# Patient Record
Sex: Female | Born: 1965 | Race: White | Hispanic: No | Marital: Married | Smoking: Never smoker
Health system: Southern US, Community
[De-identification: ages and names within clinical notes are randomized; demographics above are authoritative.]

## PROBLEM LIST (undated history)

## (undated) DIAGNOSIS — R519 Headache, unspecified: Secondary | ICD-10-CM

## (undated) DIAGNOSIS — K219 Gastro-esophageal reflux disease without esophagitis: Secondary | ICD-10-CM

## (undated) DIAGNOSIS — D649 Anemia, unspecified: Secondary | ICD-10-CM

## (undated) DIAGNOSIS — G4700 Insomnia, unspecified: Secondary | ICD-10-CM

## (undated) DIAGNOSIS — E611 Iron deficiency: Secondary | ICD-10-CM

## (undated) HISTORY — DX: Gastro-esophageal reflux disease without esophagitis: K21.9

## (undated) HISTORY — DX: Anemia, unspecified: D64.9

## (undated) HISTORY — DX: Insomnia, unspecified: G47.00

## (undated) HISTORY — PX: DENTAL SURGERY: SHX609

## (undated) HISTORY — DX: Iron deficiency: E61.1

## (undated) HISTORY — DX: Headache, unspecified: R51.9

---

## 2005-04-27 ENCOUNTER — Encounter: Payer: Self-pay | Admitting: Internal Medicine

## 2005-05-12 ENCOUNTER — Encounter: Payer: Self-pay | Admitting: Internal Medicine

## 2006-06-13 ENCOUNTER — Encounter: Payer: Self-pay | Admitting: Internal Medicine

## 2008-11-27 ENCOUNTER — Encounter: Payer: Self-pay | Admitting: Internal Medicine

## 2008-12-12 ENCOUNTER — Encounter: Payer: Self-pay | Admitting: Internal Medicine

## 2009-03-03 ENCOUNTER — Encounter: Payer: Self-pay | Admitting: Internal Medicine

## 2009-06-23 ENCOUNTER — Encounter: Payer: Self-pay | Admitting: Internal Medicine

## 2009-06-26 ENCOUNTER — Encounter: Payer: Self-pay | Admitting: Internal Medicine

## 2009-07-14 ENCOUNTER — Encounter: Payer: Self-pay | Admitting: Internal Medicine

## 2009-08-12 ENCOUNTER — Ambulatory Visit: Payer: Self-pay | Admitting: Internal Medicine

## 2009-08-12 DIAGNOSIS — J45909 Unspecified asthma, uncomplicated: Secondary | ICD-10-CM | POA: Insufficient documentation

## 2009-08-26 ENCOUNTER — Ambulatory Visit: Payer: Self-pay | Admitting: Internal Medicine

## 2009-09-08 ENCOUNTER — Encounter: Payer: Self-pay | Admitting: Internal Medicine

## 2010-02-09 ENCOUNTER — Telehealth (INDEPENDENT_AMBULATORY_CARE_PROVIDER_SITE_OTHER): Payer: Self-pay | Admitting: *Deleted

## 2010-05-11 NOTE — Progress Notes (Signed)
Summary: oil based vitamins ok in moderation  Phone Note Call from Patient Call back at [phone]   Caller: Patient Call For: Raziah Funnell Summary of Call: Wants to know if it will be ok for her to take visalus if she didn't take the oily part of the vitamin, pls advise. Initial call taken by: Darletta Moll,  February 09, 2010 1:13 PM  Follow-up for Phone Call        ok to use this and even take oil based vitamins in moderation  but take at breakfast and stop them if start any coughing or wheezing  Follow-up by: Nyoka Cowden MD,  February 09, 2010 3:02 PM  Additional Follow-up for Phone Call Additional follow up Details #1::        Called, spoke with pt. She was informed of above per MW and verbalized understanding. Additional Follow-up by: Gweneth Dimitri RN,  February 09, 2010 3:13 PM

## 2010-05-11 NOTE — Letter (Signed)
Summary: [facility]  [facility]   Imported By: Sherian Rein 08/21/2009 12:25:35  _____________________________________________________________________  External Attachment:    Type:   Image     Comment:   External Document

## 2010-05-11 NOTE — Letter (Signed)
Summary: [facility]   [facility]   Imported By: Sherian Rein 08/21/2009 12:19:15  _____________________________________________________________________  External Attachment:    Type:   Image     Comment:   External Document

## 2010-05-11 NOTE — Assessment & Plan Note (Signed)
Summary: Pulmonary/ ext summary f/u ov with hfa teaching @ 75%   Copy to:  Self  CC:  2 wk followup.  Pt states that the cough has almost completely resolved.  She states that symbicort helps alot.  No complaints today.  Marland Kitchen  History of Present Illness: 73 yowf never regular smoker dx with asthma age 45 resolved after several years then around 2007 developed urge to clear throat > asthma challenge test positive in [place] but no meds prescribed and continued to have the throat clearing then Aug 2010 developed cough and wheeze and doe and never able to achieve consistent control of symptoms since then on advair and singulair maint.  Aug 12, 2009 cc cough minimal green mucus and gag and vomit with it, on advair and singulair x 3 months no benefit, best proaire and using nebulizer twice a night. rec change to symbicort 160, rx gerd.  Aug 26, 2009 cc  cough has almost completely resolved.  She states that symbicort helps alot.  No complaints today.  Pt denies any significant sore throat, dysphagia, itching, sneezing,  nasal congestion or excess secretions,  fever, chills, sweats, unintended wt loss, pleuritic or exertional cp, hempoptysis, change in activity tolerance  orthopnea pnd or leg swelling Pt also denies any obvious fluctuation in symptoms with weather or environmental change or other alleviating or aggravating factors.     Pt denies any increase in rescue therapy over baseline, denies waking up needing it or having early am exacerbations of coughing/wheezing/ or dyspnea      Current Medications (verified): 1)  Symbicort 160-4.5 Mcg/act  Aero (Budesonide-Formoterol Fumarate) .... 2 Puffs First Thing  in Am and 2 Puffs Again in Pm About 12 Hours Later 2)  Dexilant 60 Mg Cpdr (Dexlansoprazole) .... Take  One 30-60 Min Before First Meal of The Day 3)  Pepcid Ac Maximum Strength 20 Mg Tabs (Famotidine) .... One At Bedtime 4)  Klonopin 0.5 Mg Tabs (Clonazepam) .Marland Kitchen.. 1 At Bedtime If Needed 5)   Proair Hfa 108 (90 Base) Mcg/act Aers (Albuterol Sulfate) .... 2 Puffs Every 4-6 Hours As Needed  Allergies (verified): 1)  ! Prednisone  Past History:  Past Medical History: Asthma    - Nl pft's 06/13/06     -nl CT chest 12/12/08     - Pos atopy Barb Merino) 2011 declined immunotherapy 07/14/2009     -HFA 50-75% Aug 12, 2009 > 75% Aug 26, 2009        Vital Signs:  Patient profile:   45 year old female Weight:      112 pounds O2 Sat:      100 % on Room air Temp:     98.3 degrees F oral Pulse rate:   62 / minute BP sitting:   112 / 72  (left arm)  Vitals Entered By: Vernie Murders (Aug 26, 2009 10:48 AM)  O2 Flow:  Room air  Physical Exam  Additional Exam:  amb wf wt 111  Aug 12, 2009 > 112 Aug 26, 2009  HEENT: nl dentition, turbinates, and orophanx. Nl external ear canals without cough reflex NECK :  without JVD/Nodes/TM/ nl carotid upstrokes bilaterally LUNGS: no acc muscle use, insp and exp rhonchi with upper and lower components CV:  RRR  no s3 or murmur or increase in P2, no edema  ABD:  soft and nontender with nl excursion in the supine position. No bruits or organomegaly, bowel sounds nl MS:  warm without deformities, calf tenderness,  cyanosis or clubbing SKIN: warm and dry without lesions   NEURO:  alert, approp, no deficits     Impression & Recommendations:  Problem # 1:  ASTHMA, UNSPECIFIED (ICD-493.90) All goals of asthma met including optimal function and elimination of symptoms with minimum need for rescue therapy. Contingencies discussed today including the rule of two's.   I spent extra time with the patient today explaining optimal mdi  technique.  This improved from  50-75% - if consistent with effective technique should be able to use just the symbiocort 160 and stop the empiric dexilant, noting that cough to vomit may have been a secondary and not a primary problem.  Each maintenance medication was reviewed in detail including most importantly the difference  between maintenance prns and under what circumstances the prns are to be used.  In addition, these two groups (for which the patient should keep up with refills) were distinguished from a third group :  meds that are used only short term with the intent to complete a course of therapy and then not refill them.  The med list was then fully reconciled and reorganized to reflect this important distinction.   Medications Added to Medication List This Visit: 1)  Symbicort 80-4.5 Mcg/act Aero (Budesonide-formoterol fumarate) .... 2 puffs first thing  in am and 2 puffs again in pm about 12 hours later  Other Orders: Est. Patient Level IV (16109)  Patient Instructions: 1)  Work on perfecting inhaler technique:  relax and blow all the way out then take a nice smooth deep breath back in, triggering the inhaler at same time you start breathing in and hold for a few secs 2)  Symbicort 80 2 puffs first thing  in am and 2 puffs again in pm about 12 hours later  3)  If your breathing worsens or you need to use your rescue inhaler more than twice weekly or wake up more than twice a month with any respiratory symptoms or require more than two rescue inhalers per year, we need to see you right away. 4)  When you dexilant run out of dexilant try off it.  5)  Return to office in 3 months, sooner if needed  Prescriptions: SYMBICORT 80-4.5 MCG/ACT  AERO (BUDESONIDE-FORMOTEROL FUMARATE) 2 puffs first thing  in am and 2 puffs again in pm about 12 hours later  #1 x 11   Entered and Authorized by:   Nyoka Cowden MD   Signed by:   Nyoka Cowden MD on 08/26/2009   Method used:   Electronically to        [facility] [address] (retail)       [address] [place], [place]  [address]       Ph: [phone]       Fax: [phone]   RxID:   [id number]

## 2010-05-11 NOTE — Op Note (Signed)
Summary: Bronchoscopy/[facility]  Bronchoscopy/[facility]   Imported By: Sherian Rein 08/21/2009 12:31:52  _____________________________________________________________________  External Attachment:    Type:   Image     Comment:   External Document

## 2010-05-11 NOTE — Letter (Signed)
Summary: [facility]  [facility]   Imported By: Sherian Rein 08/21/2009 12:23:31  _____________________________________________________________________  External Attachment:    Type:   Image     Comment:   External Document

## 2010-05-11 NOTE — Assessment & Plan Note (Signed)
Summary: Pulmonary/ new pt eval for dtc asthma > change to symbicort   Visit Type:  Initial Consult Copy to:  Self  CC:  Cough and Dyspnea.  History of Present Illness: 55 yowf never regular smoker dx with asthma age 45 resolved after several years then around 2007 developed urge to clear throat > asthma challenge test positive but no meds prescribed and continued to have the throat clearing then Aug 2010 developed cough and wheeze and doe and never able to achieve consistent control of symptoms since then on advair and singulair maint.  Aug 12, 2009 cc cough minimal green mucus and gag and vomit with it, on advair and singulair x 3 months no benefit, best proaire and using nebulizer twice a night.  Pt denies any significant  dysphagia, itching, sneezing,  nasal congestion or excess secretions,  fever, chills, sweats, unintended wt loss, pleuritic or exertional cp, hempoptysis, change in activity tolerance  orthopnea pnd or leg swelling Pt also denies any obvious fluctuation in symptoms with weather or environmental change or other alleviating or aggravating factors.       Current Medications (verified): 1)  Singulair 10 Mg Tabs (Montelukast Sodium) .Marland Kitchen.. 1 Once Daily 2)  Advair Diskus 250-50 Mcg/dose Aepb (Fluticasone-Salmeterol) .Marland Kitchen.. 1 Puff Two Times A Day 3)  Diphenhist 25 Mg Tabs (Diphenhydramine Hcl) .... 2 At Bedtime As Needed 4)  Tussionex Pennkinetic Er 8-10 Mg/19ml Lqcr (Chlorpheniramine-Hydrocodone) .Marland Kitchen.. 1 Tsp Every 12 Hours As Needed 5)  Proair Hfa 108 (90 Base) Mcg/act Aers (Albuterol Sulfate) .... 2 Puffs Every 4-6 Hours As Needed 6)  Dexilant 60 Mg Cpdr (Dexlansoprazole) .Marland Kitchen.. 1 Once Daily 7)  Klonopin 0.5 Mg Tabs (Clonazepam) .Marland Kitchen.. 1 At Bedtime 8)  Tramadol Hcl 50 Mg Tabs (Tramadol Hcl) .Marland Kitchen.. 1 Once Daily As Needed 9)  Mucinex Dm 30-600 Mg Xr12h-Tab (Dextromethorphan-Guaifenesin) .Marland Kitchen.. 1 To 2 Every 12 Hours As Needed 10)  Ativan 0.5 Mg Tabs (Lorazepam) .Marland Kitchen.. 1 Two Times A Day As  Needed  Allergies (verified): 1)  ! 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  The patient denies coughing up blood, irregular heartbeats, acid heartburn, indigestion, loss of appetite, weight change, abdominal pain, difficulty swallowing, tooth/dental problems, nasal congestion/difficulty breathing through nose, sneezing, itching, ear ache, depression, hand/feet swelling, joint stiffness or pain, rash, change in color of mucus, and fever.    Vital Signs:  Patient profile:   45 year old female Height:      62 inches Weight:      111.25 pounds BMI:     20.42 O2 Sat:      96 % on Room air Temp:     97.9 degrees F oral Pulse rate:   113 / minute BP sitting:   148 / 92  (left arm)  Vitals Entered By: Vernie Murders (Aug 12, 2009 9:14 AM)  O2 Flow:  Room air  Physical Exam  Additional Exam:  amb wf wt 111  Aug 12, 2009 HEENT: nl dentition, turbinates, and orophanx. Nl external ear canals without cough reflex NECK :  without JVD/Nodes/TM/ nl carotid upstrokes bilaterally LUNGS: no acc muscle use, insp and exp rhonchi with upper and lower components CV:  RRR  no s3 or murmur or increase in P2, no edema  ABD:  soft and nontender with nl excursion in the supine position. No bruits or organomegaly, bowel sounds nl MS:  warm without deformities, calf tenderness, cyanosis or clubbing SKIN: warm and dry without lesions   NEURO:  alert, approp, no deficits      Impression & Recommendations:  Problem # 1:  ASTHMA, UNSPECIFIED (ICD-493.90) DDX of  difficult airways managment all start with A and  include Adherence, Ace Inhibitors, Acid Reflux, Active Sinus Disease, Alpha 1 Antitripsin deficiency, Anxiety masquerading as Airways dz,  ABPA,  allergy(esp in young), Aspiration (esp in elderly), Adverse effects of DPI,  Active smokers, plus one B  = Beta blocker use..   Adherence: I spent extra time with the patient today explaining optimal mdi  technique.  This improved from  50-75% effective Acid reflux :  ppi and diet reviewed Adverse effect of DPI, try off advair and on symbicort Anxiety: try change to just clonazepam use and stop ativan biggest issue and starts with  inability to use HFA effectively and also  understand that SABA treats the symptoms but doesn't get to the underlying problem (inflammation).  I used  the ananology of putting steroid cream on a rash to help explain the meaning of topical therapy and the need to get the drug to the target tissue.   a course of prednisone should help normalize lung function and let her learn optimal inspiratory technique.  Medications Added to Medication List This Visit: 1)  Symbicort 160-4.5 Mcg/act Aero (Budesonide-formoterol fumarate) .... 2 puffs first thing  in am and 2 puffs again in pm about 12 hours later 2)  Advair Diskus 250-50 Mcg/dose Aepb (Fluticasone-salmeterol) .Marland Kitchen.. 1 puff two times a day 3)  Singulair 10 Mg Tabs (Montelukast sodium) .Marland Kitchen.. 1 once daily 4)  Dexilant 60 Mg Cpdr (Dexlansoprazole) .... Take  one 30-60 min before first meal of the day 5)  Pepcid Ac Maximum Strength 20 Mg Tabs (Famotidine) .... One at bedtime 6)  Dexilant 60 Mg Cpdr (Dexlansoprazole) .Marland Kitchen.. 1 once daily 7)  Klonopin 0.5 Mg Tabs (Clonazepam) .Marland Kitchen.. 1 at bedtime 8)  Tramadol Hcl 50 Mg Tabs (Tramadol hcl) .Marland Kitchen.. 1 once daily as needed 9)  Klonopin 0.5 Mg Tabs (Clonazepam) .Marland Kitchen.. 1 at bedtime if needed 10)  Tramadol Hcl 50  Mg Tabs (Tramadol hcl) .Marland Kitchen.. 1-2 every 4 hours if coughing 11)  Mucinex Dm 30-600 Mg Xr12h-tab (Dextromethorphan-guaifenesin) .Marland Kitchen.. 1 to 2 every 12 hours as needed 12)  Ativan 0.5 Mg Tabs (Lorazepam) .Marland Kitchen.. 1 two times a day as needed 13)  Diphenhist 25 Mg Tabs (Diphenhydramine hcl) .... 2 at bedtime as needed 14)  Tussionex Pennkinetic Er 8-10 Mg/74ml Lqcr (Chlorpheniramine-hydrocodone) .Marland Kitchen.. 1 tsp every 12 hours as needed 15)  Proair Hfa 108 (90 Base) Mcg/act Aers (Albuterol sulfate) .... 2 puffs every 4-6 hours as needed 16)  Prednisone 10 Mg Tabs (Prednisone) .... 4 each am x 2days, 2x2days, 1x2days and stop  Complete Medication List: 1)  Symbicort 160-4.5 Mcg/act Aero (Budesonide-formoterol fumarate) .... 2 puffs first thing  in am and 2 puffs again in pm about 12 hours later 2)  Singulair 10 Mg Tabs (Montelukast sodium) .Marland Kitchen.. 1 once daily 3)  Dexilant 60 Mg Cpdr (Dexlansoprazole) .... Take  one 30-60 min before first meal of the day 4)  Pepcid Ac Maximum Strength 20 Mg Tabs (Famotidine) .... One at bedtime 5)  Klonopin 0.5 Mg Tabs (Clonazepam) .Marland Kitchen.. 1 at bedtime if needed 6)  Tramadol Hcl 50 Mg Tabs (Tramadol hcl) .Marland Kitchen.. 1-2 every 4 hours if coughing 7)  Mucinex Dm 30-600 Mg Xr12h-tab (Dextromethorphan-guaifenesin) .Marland Kitchen.. 1 to 2 every 12 hours as needed 8)  Proair Hfa 108 (90 Base) Mcg/act Aers (Albuterol sulfate) .... 2 puffs every 4-6 hours as needed 9)  Prednisone 10 Mg Tabs (Prednisone) .... 4 each am x 2days, 2x2days, 1x2days and stop  Other Orders: New Patient Level V (78295)  Patient Instructions: 1)  stop advair, tussionex 2)  Start symbicort 160 2 puffs first thing  in am and 2 puffs again in pm about 12 hours later and Work on inhaler technique:  relax and blow all the way out then take a nice smooth deep breath back in, triggering the inhaler at same time you start breathing in and hold a few secs 3)  Take mucinex dm every 12 hours and add tramadol 50 mg up to 2 every 4 hours to  suppress the urge to cough. Swallowing water or using ice chips/non mint and menthol containing candies (such as lifesavers or sugarless jolly ranchers) are also effective.  4)  Add pecid ac 20 mg one at bedtime 5)  Dexilant 60 mg Take  one 30-60 min before first meal of the day  6)  GERD (REFLUX)  is a common cause of respiratory symptoms. It commonly presents without heartburn and can be treated with medication, but also with lifestyle changes including avoidance of late meals, excessive alcohol, smoking cessation, and avoid fatty foods, chocolate, peppermint, colas, red wine, and acidic juices such as orange juice. NO MINT OR MENTHOL PRODUCTS SO NO COUGH DROPS  7)  USE SUGARLESS CANDY INSTEAD (jolley ranchers)  8)  NO OIL BASED VITAMINS  9)   Please schedule a follow-up appointment in 2 weeks, sooner if needed  Prescriptions: KLONOPIN 0.5 MG TABS (CLONAZEPAM) 1 at bedtime if needed  #30 x 0   Entered and Authorized by:   Nyoka Cowden MD   Signed by:   Nyoka Cowden MD on 08/12/2009   Method used:   Print then Give to Patient   RxID:   [id number] PREDNISONE 10 MG  TABS (PREDNISONE) 4 each am x 2days, 2x2days, 1x2days and stop  #14 x 0   Entered and Authorized by:   Nyoka Cowden MD   Signed by:   Nyoka Cowden MD on 08/12/2009   Method used:   Electronically to        [facility] [phone]* (retail)       [address] [place], [place]  [address]       Ph: [phone]       Fax: [phone]   RxID:   [id number]

## 2010-05-11 NOTE — Letter (Signed)
Summary: [facility]  [facility]   Imported By: Sherian Rein 08/21/2009 12:20:09  _____________________________________________________________________  External Attachment:    Type:   Image     Comment:   External Document

## 2010-05-11 NOTE — Letter (Signed)
Summary: [facility]   Imported By: Sherian Rein 09/23/2009 09:07:31  _____________________________________________________________________  External Attachment:    Type:   Image     Comment:   External Document

## 2010-11-03 ENCOUNTER — Telehealth: Payer: Self-pay | Admitting: Internal Medicine

## 2010-11-03 MED ORDER — BUDESONIDE-FORMOTEROL FUMARATE 80-4.5 MCG/ACT IN AERO: 2.0000 | INHALATION_SPRAY | Freq: Two times a day (BID) | RESPIRATORY_TRACT | Status: DC

## 2010-11-03 NOTE — Telephone Encounter (Signed)
Pt last seen by MW 5.18.11 and told to follow up in 1 month.  symbicort 80-4.59mcg was sent with 11 refills at that office visit.  [facility] 1 refill on this medication as she is overdue for appt.  I also LMOM for pt informing her that her requested refill was sent to her pharmacy with 1 refill and that she needs to call the office to schedule a follow up as she is overdue.  Will sign off on message.

## 2010-11-03 NOTE — Telephone Encounter (Signed)
[facility] SAYS THEY DO NOT HAVE PRESCRIPTION FOR SYMBICORT ON FILE.  PLEASE CALL TAMMY AT [phone]

## 2015-02-09 ENCOUNTER — Encounter (INDEPENDENT_AMBULATORY_CARE_PROVIDER_SITE_OTHER): Payer: Self-pay | Admitting: *Deleted

## 2015-05-11 ENCOUNTER — Institutional Professional Consult (permissible substitution): Payer: Self-pay | Admitting: Internal Medicine

## 2019-12-05 ENCOUNTER — Encounter (INDEPENDENT_AMBULATORY_CARE_PROVIDER_SITE_OTHER): Payer: Self-pay | Admitting: Gastroenterology

## 2019-12-05 ENCOUNTER — Other Ambulatory Visit: Payer: Self-pay

## 2019-12-05 ENCOUNTER — Encounter (INDEPENDENT_AMBULATORY_CARE_PROVIDER_SITE_OTHER): Payer: Self-pay | Admitting: *Deleted

## 2019-12-05 ENCOUNTER — Ambulatory Visit (INDEPENDENT_AMBULATORY_CARE_PROVIDER_SITE_OTHER): Payer: PRIVATE HEALTH INSURANCE | Admitting: Gastroenterology

## 2019-12-05 VITALS — BP 129/82 | HR 64 | Temp 99.2°F | Ht 62.0 in | Wt 116.1 lb

## 2019-12-05 DIAGNOSIS — R6889 Other general symptoms and signs: Secondary | ICD-10-CM | POA: Insufficient documentation

## 2019-12-05 DIAGNOSIS — R0989 Other specified symptoms and signs involving the circulatory and respiratory systems: Secondary | ICD-10-CM | POA: Insufficient documentation

## 2019-12-05 DIAGNOSIS — Z01812 Encounter for preprocedural laboratory examination: Secondary | ICD-10-CM | POA: Diagnosis not present

## 2019-12-05 DIAGNOSIS — R062 Wheezing: Secondary | ICD-10-CM | POA: Diagnosis not present

## 2019-12-05 MED ORDER — ESOMEPRAZOLE MAGNESIUM 40 MG PO CPDR: 40.0000 mg | DELAYED_RELEASE_CAPSULE | Freq: Every day | ORAL | 11 refills | Status: DC

## 2019-12-05 NOTE — Progress Notes (Signed)
[name], M.D. Gastroenterology & Hepatology [facility] [address] [place], [place] [address] Primary Care Physician: Olen Cordial, MD [address] [place] [address]  Referring MD: PCP  I will communicate my assessment and recommendations to the referring MD via EMR. Note: Occasional unusual wording and randomly placed punctuation marks may result from the use of speech recognition technology to transcribe this document"  Chief Complaint: Wheezing and shortness of breath  History of Present Illness: Beth [name] is a 54 y.o. female with past medical history of LPR, who presents for evaluation of recurrent episodes of wheezing and shortness of breath.  The patient states that close to 11 years ago she presented new onset shortness of breath and wheezing episodes, usually at night.  Due to these she was evaluated by a pulmonologist in [place] who performed allergy testing, she was found to have allergy to dust mites.  The patient was ordered to have allergy desensitization, but she looked for a second opinion with Dr. Annabell Howells (pulmonology), who considered her symptoms were related to acid reflux.  Since then the patient has been taking Pepcid AR on a daily basis which completely resolved her symptoms.  However, the patient reports that for the last 3 months she has had recurrent episodes of wheezing especially at night despite taking her Pepcid.  She denies having any cough but when the wheezing is bad she feels short of breath.  The patient was given the advice to take omeprazole on a daily basis, she has tried taking omeprazole 20 mg every day inconsistently but try compliantly for the last week without any significant improvement.  She denies any heartburn, odynophagia or dysphagia.  Notably, the patient states that recently she has been under significant stress due to family problems with her sisters.  The patient  denies having any nausea, vomiting, fever, chills, hematochezia, melena, hematemesis, abdominal distention, abdominal pain, diarrhea, jaundice, pruritus or weight loss.  I received the report from Dr. Ree Kida B. Spainhour who performed an EGD in 2015 for LPR.  At that time the patient was taking Dexilant without any relief.  There was presence of erythema in the distal esophagus which was biopsied.  There was presence of an esophageal ring which was dilated to 61 [place] with a Maloney dilator.  No other alterations were found.  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  Last Colonoscopy: 4 years ago, normal per the patient  FHx: neg for any gastrointestinal/liver disease, grandfather had lung cancer, mother has sarcoidosis Social: neg smoking, alcohol or illicit drug use Surgical: C-section  Past Medical History: Past Medical History:  Diagnosis Date  . Anemia   . GERD (gastroesophageal reflux disease)   . Headache   . Insomnia   . Iron deficiency     Past Surgical History: Past Surgical History:  Procedure Laterality Date  . CESAREAN SECTION    . DENTAL SURGERY      Family History: Family History  Problem Relation Age of Onset  . Atrial fibrillation Mother   . Lung disease Mother   . Lung disease Father   . Hypertension Father   . Heart disease Father     Social History: Social History   Tobacco Use  Smoking Status Never Smoker  Smokeless Tobacco Never Used   Social History   Substance and Sexual Activity  Alcohol Use Yes  . Alcohol/week: 4.0 - 6.0 standard drinks  . Types: 1 - 2 Cans of beer, 3 -  4 Shots of liquor per week   Comment: on the weekend    Social History   Substance and Sexual Activity  Drug Use Not Currently  . Types: Marijuana   Comment: occ    Allergies: Allergies  Allergen Reactions  . Prednisone     Medications: Current Outpatient Medications  Medication Sig Dispense Refill  . ALPRAZolam (XANAX) 0.25 MG tablet Take 0.25  mg by mouth daily as needed.    . Biotin 2500 MCG CAPS Take by mouth. 1 tab once a day    . Camphor (JOINTFLEX EX) Apply topically. 1 tab once a day    . cholecalciferol (VITAMIN D3) 25 MCG (1000 UNIT) tablet Take 1,000 Units by mouth daily.    . diphenhydrAMINE (BENADRYL) 25 mg capsule Take by mouth.    . famotidine (PEPCID) 10 MG tablet Take by mouth.    Marland Kitchen PARoxetine (PAXIL) 10 MG tablet Take 10 mg by mouth daily.    . valACYclovir (VALTREX) 1000 MG tablet      No current facility-administered medications for this visit.    Review of Systems: GENERAL: negative for malaise, night sweats HEENT: No changes in hearing or vision, no nose bleeds or other nasal problems. NECK: Negative for lumps, goiter, pain and significant neck swelling RESPIRATORY: Negative for cough, wheezing CARDIOVASCULAR: Negative for chest pain, leg swelling, palpitations, orthopnea GI: SEE HPI MUSCULOSKELETAL: Negative for joint pain or swelling, back pain, and muscle pain. SKIN: Negative for lesions, rash PSYCH: Negative for sleep disturbance, mood disorder and recent psychosocial stressors. HEMATOLOGY Negative for prolonged bleeding, bruising easily, and swollen nodes. ENDOCRINE: Negative for cold or heat intolerance, polyuria, polydipsia and goiter. NEURO: negative for tremor, gait imbalance, syncope and seizures. The remainder of the review of systems is noncontributory.   Physical Exam: BP 129/82 (BP Location: Left Arm, Patient Position: Sitting, Cuff Size: Normal)   Pulse 64   Temp 99.2 F (37.3 C) (Oral)   Ht 5\' 2"  (1.575 m)   Wt 116 lb 1.6 oz (52.7 kg)   BMI 21.23 kg/m  GENERAL: The patient is AO x3, in no acute distress. HEENT: Head is normocephalic and atraumatic. EOMI are intact. Mouth is well hydrated and without lesions. NECK: Supple. No masses LUNGS: Clear to auscultation. No presence of rhonchi/wheezing/rales. Adequate chest expansion HEART: RRR, normal s1 and s2. ABDOMEN: Soft, nontender,  no guarding, no peritoneal signs, and nondistended. BS +. No masses. EXTREMITIES: Without any cyanosis, clubbing, rash, lesions or edema. NEUROLOGIC: AOx3, no focal motor deficit. SKIN: no jaundice, no rashes   Imaging/Labs: as above  I personally reviewed and interpreted the available labs, imaging and endoscopic files.  Impression and Plan: Beth [name] is a 54 y.o. female with past medical history of LPR, who presents for evaluation of recurrent episodes of wheezing and shortness of breath.  The patient is presenting symptoms are atypical for GERD.  She had good control of her symptoms with Pepcid but minimal improvement while taking PPI recently and potentially 6 years ago.  At this point, we will perform an endoscopic evaluation with an EGD with possible esophageal biopsies.  She will be started on Nexium 40 mg every day as this may have been reported she done her current dose of omeprazole.  She was advised to take it on a daily basis and will follow up afterwards to determine if any further studies are needed.  She can also continue taking her Pepcid as prior.  If this investigations are negative and she does  not have any improvement with a PPI trial, will need to perform a pH impedance and manometry testing of PPI for 2 weeks.  I informed this to the patient who understood and agreed.  -Schedule EGD -Start Nexium 40 mg qday -Continue with Pepcid daily  All questions were answered.      [name], MD Gastroenterology and Hepatology [facility]

## 2019-12-05 NOTE — Patient Instructions (Addendum)
Schedule EGD Start Nexium 40 mg qday Continue with Pepcid daily

## 2019-12-06 ENCOUNTER — Other Ambulatory Visit (INDEPENDENT_AMBULATORY_CARE_PROVIDER_SITE_OTHER): Payer: Self-pay | Admitting: *Deleted

## 2019-12-20 ENCOUNTER — Other Ambulatory Visit: Payer: Self-pay

## 2019-12-20 ENCOUNTER — Other Ambulatory Visit (HOSPITAL_COMMUNITY)
Admission: RE | Admit: 2019-12-20 | Discharge: 2019-12-20 | Disposition: A | Discharge status: Home / Self Care | Payer: PRIVATE HEALTH INSURANCE | Source: Ambulatory Visit | Attending: Gastroenterology | Admitting: Gastroenterology

## 2019-12-20 DIAGNOSIS — Z20822 Contact with and (suspected) exposure to covid-19: Secondary | ICD-10-CM | POA: Diagnosis not present

## 2019-12-20 DIAGNOSIS — Z01812 Encounter for preprocedural laboratory examination: Secondary | ICD-10-CM | POA: Diagnosis present

## 2019-12-21 LAB — SARS CORONAVIRUS 2 (TAT 6-24 HRS): SARS Coronavirus 2: NEGATIVE

## 2019-12-24 ENCOUNTER — Ambulatory Visit (HOSPITAL_COMMUNITY): Payer: PRIVATE HEALTH INSURANCE | Admitting: Anesthesiology

## 2019-12-24 ENCOUNTER — Other Ambulatory Visit: Payer: Self-pay

## 2019-12-24 ENCOUNTER — Encounter (HOSPITAL_COMMUNITY)
Admission: RE | Disposition: A | Discharge status: Home / Self Care | Payer: Self-pay | Source: Home / Self Care | Attending: Gastroenterology

## 2019-12-24 ENCOUNTER — Ambulatory Visit (HOSPITAL_COMMUNITY)
Admission: RE | Admit: 2019-12-24 | Discharge: 2019-12-24 | Disposition: A | Discharge status: Home / Self Care | Payer: PRIVATE HEALTH INSURANCE | Attending: Gastroenterology | Admitting: Gastroenterology

## 2019-12-24 ENCOUNTER — Encounter (HOSPITAL_COMMUNITY): Payer: Self-pay | Admitting: Gastroenterology

## 2019-12-24 DIAGNOSIS — R062 Wheezing: Secondary | ICD-10-CM | POA: Diagnosis present

## 2019-12-24 DIAGNOSIS — K219 Gastro-esophageal reflux disease without esophagitis: Secondary | ICD-10-CM | POA: Diagnosis not present

## 2019-12-24 DIAGNOSIS — R0602 Shortness of breath: Secondary | ICD-10-CM | POA: Diagnosis not present

## 2019-12-24 DIAGNOSIS — R6889 Other general symptoms and signs: Secondary | ICD-10-CM | POA: Diagnosis not present

## 2019-12-24 DIAGNOSIS — Z888 Allergy status to other drugs, medicaments and biological substances status: Secondary | ICD-10-CM | POA: Insufficient documentation

## 2019-12-24 DIAGNOSIS — Z79899 Other long term (current) drug therapy: Secondary | ICD-10-CM | POA: Insufficient documentation

## 2019-12-24 DIAGNOSIS — R519 Headache, unspecified: Secondary | ICD-10-CM | POA: Insufficient documentation

## 2019-12-24 HISTORY — PX: ESOPHAGOGASTRODUODENOSCOPY (EGD) WITH PROPOFOL: SHX5813

## 2019-12-24 SURGERY — ESOPHAGOGASTRODUODENOSCOPY (EGD) WITH PROPOFOL
Anesthesia: General

## 2019-12-24 MED ORDER — STERILE WATER FOR IRRIGATION IR SOLN
Status: DC | PRN
  Administered 2019-12-24: 100 mL

## 2019-12-24 MED ORDER — PROPOFOL 500 MG/50ML IV EMUL
INTRAVENOUS | Status: DC | PRN
  Administered 2019-12-24: 200 ug/kg/min via INTRAVENOUS

## 2019-12-24 MED ORDER — LACTATED RINGERS IV SOLN: Freq: Once | INTRAVENOUS | Status: AC

## 2019-12-24 MED ORDER — GLYCOPYRROLATE 0.2 MG/ML IJ SOLN
INTRAMUSCULAR | Status: AC
  Administered 2019-12-24: 0.2 mg via INTRAVENOUS
  Filled 2019-12-24: qty 1

## 2019-12-24 MED ORDER — LACTATED RINGERS IV SOLN: INTRAVENOUS | Status: DC | PRN

## 2019-12-24 MED ORDER — LIDOCAINE VISCOUS HCL 2 % MT SOLN: 15.0000 mL | Freq: Once | OROMUCOSAL | Status: AC

## 2019-12-24 MED ORDER — LIDOCAINE VISCOUS HCL 2 % MT SOLN
OROMUCOSAL | Status: AC
  Administered 2019-12-24: 15 mL via OROMUCOSAL
  Filled 2019-12-24: qty 15

## 2019-12-24 MED ORDER — GLYCOPYRROLATE 0.2 MG/ML IJ SOLN: 0.2000 mg | Freq: Once | INTRAMUSCULAR | Status: AC

## 2019-12-24 MED ORDER — PROPOFOL 10 MG/ML IV BOLUS
INTRAVENOUS | Status: DC | PRN
  Administered 2019-12-24: 100 mg via INTRAVENOUS

## 2019-12-24 NOTE — H&P (Signed)
Beth [name] is an 54 y.o. female.   Chief Complaint: wheezing and SOB HPI: Beth [name] is a 54 y.o. female with past medical history of LPR, who comes to the hospital for evaluation of recurrent episodes of wheezing and shortness of breath.  Patient has had longstanding history of shortness of breath for the last 11 years and wheezing episodes, usually at night.  She has had multiple allergy testing and been evaluated by pulmonologist in the past, who initially prescribed Pepcid with resolution of symptoms.  However, for the last 3 months she has had recurrent wheezing and shortness of breath for which she was started on omeprazole 20 mg every day inconsistently.  Never had any heartburn, odynophagia or dysphagia.  Due to persistence of symptoms on PPI, she was increased to omeprazole 40 mg every day which has improved but not completely resolve her symptoms.  Last EGD was performed in 2015 - There was presence of erythema in the distal esophagus which was biopsied.  There was presence of an esophageal ring which was dilated to 42 [place] with a Maloney dilator.   Past Medical History:  Diagnosis Date  . Anemia   . GERD (gastroesophageal reflux disease)   . Headache   . Insomnia   . Iron deficiency     Past Surgical History:  Procedure Laterality Date  . CESAREAN SECTION    . DENTAL SURGERY      Family History  Problem Relation Age of Onset  . Atrial fibrillation Mother   . Lung disease Mother   . Lung disease Father   . Hypertension Father   . Heart disease Father    Social History:  reports that she has never smoked. She has never used smokeless tobacco. She reports current alcohol use of about 4.0 - 6.0 standard drinks of alcohol per week. She reports previous drug use. Drug: Marijuana.  Allergies:  Allergies  Allergen Reactions  . Prednisone     Unknown reaction    Medications Prior to Admission  Medication Sig Dispense Refill  . ALPRAZolam (XANAX) 0.25 MG  tablet Take 0.125 mg by mouth daily as needed for anxiety.     . Aspirin-Caffeine (ANACIN) 400-32 MG TABS Take 2 tablets by mouth daily as needed (headaches/pain).    . Biotin 19417 MCG TABS Take 10,000 mcg by mouth daily.    . Cholecalciferol (VITAMIN D) 50 MCG (2000 UT) tablet Take 2,000 Units by mouth daily.    . diphenhydrAMINE (BENADRYL) 25 mg capsule Take 25 mg by mouth at bedtime.     Marland Kitchen esomeprazole (NEXIUM) 40 MG capsule Take 1 capsule (40 mg total) by mouth daily before breakfast. 30 capsule 11  . famotidine (PEPCID) 20 MG tablet Take 20 mg by mouth at bedtime.    . naproxen sodium (ALEVE) 220 MG tablet Take 220 mg by mouth daily as needed (headaches).    Marland Kitchen OVER THE COUNTER MEDICATION Take 1 tablet by mouth daily. Joint Flex otc supplement    . PARoxetine (PAXIL) 10 MG tablet Take 10 mg by mouth daily.      No results found for this or any previous visit (from the past 48 hour(s)). No results found.  Review of Systems  Constitutional: Negative.   HENT: Negative.   Eyes: Negative.   Respiratory: Positive for shortness of breath and wheezing.   Cardiovascular: Negative.   Gastrointestinal: Negative.   Endocrine: Negative.   Genitourinary: Negative.   Musculoskeletal: Negative.   Allergic/Immunologic: Negative.   Neurological:  Negative.   Hematological: Negative.   Psychiatric/Behavioral: Negative.     Blood pressure (!) 143/77, temperature (!) 97 F (36.1 C), temperature source Oral, resp. rate 13, height 5\' 2"  (1.575 m), SpO2 100 %. Physical Exam  GENERAL: The patient is AO x3, in no acute distress. HEENT: Head is normocephalic and atraumatic. EOMI are intact. Mouth is well hydrated and without lesions. NECK: Supple. No masses LUNGS: Clear to auscultation. No presence of rhonchi/wheezing/rales. Adequate chest expansion HEART: RRR, normal s1 and s2. ABDOMEN: Soft, nontender, no guarding, no peritoneal signs, and nondistended. BS +. No masses. EXTREMITIES: Without any  cyanosis, clubbing, rash, lesions or edema. NEUROLOGIC: AOx3, no focal motor deficit. SKIN: no jaundice, no rashes  Assessment/Plan Beth [name] is a 54 y.o. female with past medical history of LPR, who comes to the hospital for evaluation of recurrent episodes of wheezing and shortness of breath.  We will proceed with EGD today.  56, MD 12/24/2019, 9:26 AM

## 2019-12-24 NOTE — Transfer of Care (Signed)
Immediate Anesthesia Transfer of Care Note  Patient: Beth [name]  Procedure(s) Performed: ESOPHAGOGASTRODUODENOSCOPY (EGD) WITH PROPOFOL (N/A )  Patient Location: PACU  Anesthesia Type:General  Level of Consciousness: awake, alert , oriented and patient cooperative  Airway & Oxygen Therapy: Patient Spontanous Breathing  Post-op Assessment: Report given to RN, Post -op Vital signs reviewed and stable and Patient moving all extremities X 4  Post vital signs: Reviewed and stable  Last Vitals:  Vitals Value Taken Time  BP    Temp    Pulse    Resp    SpO2      Last Pain:  Vitals:   12/24/19 0929  TempSrc:   PainSc: 0-No pain      Patients Stated Pain Goal: 8 (12/24/19 0815)  Complications: No complications documented.

## 2019-12-24 NOTE — Discharge Instructions (Signed)
You are being discharged home Restart previous diet Continue prior medications Follow up appointment in 4 weeks in GI clinic.  October 14 @ 10:30.  PATIENT INSTRUCTIONS POST-ANESTHESIA  IMMEDIATELY FOLLOWING SURGERY:  Do not drive or operate machinery for the first twenty four hours after surgery.  Do not make any important decisions for twenty four hours after surgery or while taking narcotic pain medications or sedatives.  If you develop intractable nausea and vomiting or a severe headache please notify your doctor immediately.  FOLLOW-UP:  Please make an appointment with your surgeon as instructed. You do not need to follow up with anesthesia unless specifically instructed to do so.  WOUND CARE INSTRUCTIONS (if applicable):  Keep a dry clean dressing on the anesthesia/puncture wound site if there is drainage.  Once the wound has quit draining you may leave it open to air.  Generally you should leave the bandage intact for twenty four hours unless there is drainage.  If the epidural site drains for more than 36-48 hours please call the anesthesia department.  QUESTIONS?:  Please feel free to call your physician or the hospital operator if you have any questions, and they will be happy to assist you.      Upper Endoscopy, Adult, Care After This sheet gives you information about how to care for yourself after your procedure. Your health care provider may also give you more specific instructions. If you have problems or questions, contact your health care provider. What can I expect after the procedure? After the procedure, it is common to have:  A sore throat.  Mild stomach pain or discomfort.  Bloating.  Nausea. Follow these instructions at home:   Follow instructions from your health care provider about what to eat or drink after your procedure.  Return to your normal activities as told by your health care provider. Ask your health care provider what activities are safe for  you.  Take over-the-counter and prescription medicines only as told by your health care provider.  Do not drive for 24 hours if you were given a sedative during your procedure.  Keep all follow-up visits as told by your health care provider. This is important. Contact a health care provider if you have:  A sore throat that lasts longer than one day.  Trouble swallowing. Get help right away if:  You vomit blood or your vomit looks like coffee grounds.  You have: ? A fever. ? Bloody, black, or tarry stools. ? A severe sore throat or you cannot swallow. ? Difficulty breathing. ? Severe pain in your chest or abdomen. Summary  After the procedure, it is common to have a sore throat, mild stomach discomfort, bloating, and nausea.  Do not drive for 24 hours if you were given a sedative during the procedure.  Follow instructions from your health care provider about what to eat or drink after your procedure.  Return to your normal activities as told by your health care provider. This information is not intended to replace advice given to you by your health care provider. Make sure you discuss any questions you have with your health care provider. Document Revised: 09/19/2017 Document Reviewed: 08/28/2017 Elsevier Patient Education  2020 [employer].

## 2019-12-24 NOTE — Anesthesia Postprocedure Evaluation (Signed)
Anesthesia Post Note  Patient: Beth [name]  Procedure(s) Performed: ESOPHAGOGASTRODUODENOSCOPY (EGD) WITH PROPOFOL (N/A )  Patient location during evaluation: Phase II Anesthesia Type: General Level of consciousness: awake, oriented, awake and alert and patient cooperative Pain management: satisfactory to patient Vital Signs Assessment: post-procedure vital signs reviewed and stable Respiratory status: spontaneous breathing, respiratory function stable and nonlabored ventilation Cardiovascular status: stable Postop Assessment: no apparent nausea or vomiting Anesthetic complications: no   No complications documented.   Last Vitals:  Vitals:   12/24/19 0815  BP: (!) 143/77  Resp: 13  Temp: (!) 36.1 C  SpO2: 100%    Last Pain:  Vitals:   12/24/19 0929  TempSrc:   PainSc: 0-No pain                 Nakeeta Sebastiani

## 2019-12-24 NOTE — Anesthesia Preprocedure Evaluation (Addendum)
Anesthesia Evaluation  Patient identified by MRN, date of birth, ID band Patient awake    Reviewed: Allergy & Precautions, NPO status , Patient's Chart, lab work & pertinent test results  History of Anesthesia Complications Negative for: history of anesthetic complications  Airway Mallampati: II  TM Distance: >3 FB Neck ROM: Full    Dental  (+) Teeth Intact, Dental Advisory Given   Pulmonary asthma (childhood) ,    Pulmonary exam normal breath sounds clear to auscultation       Cardiovascular Exercise Tolerance: Good Normal cardiovascular exam Rhythm:Regular Rate:Normal     Neuro/Psych  Headaches, negative psych ROS   GI/Hepatic GERD  Medicated and Controlled,(+)     substance abuse  alcohol use,   Endo/Other  negative endocrine ROS  Renal/GU negative Renal ROS     Musculoskeletal negative musculoskeletal ROS (+)   Abdominal   Peds  Hematology negative hematology ROS (+) anemia ,   Anesthesia Other Findings   Reproductive/Obstetrics negative OB ROS                           Anesthesia Physical Anesthesia Plan  ASA: II  Anesthesia Plan: General   Post-op Pain Management:    Induction: Intravenous  PONV Risk Score and Plan: TIVA  Airway Management Planned: Nasal Cannula and Natural Airway  Additional Equipment:   Intra-op Plan:   Post-operative Plan:   Informed Consent: I have reviewed the patients History and Physical, chart, labs and discussed the procedure including the risks, benefits and alternatives for the proposed anesthesia with the patient or authorized representative who has indicated his/her understanding and acceptance.     Dental advisory given  Plan Discussed with: CRNA and Surgeon  Anesthesia Plan Comments:         Anesthesia Quick Evaluation

## 2019-12-24 NOTE — Op Note (Signed)
[facility] Patient Name: Beth [name] Procedure Date: 12/24/2019 9:13 AM MRN: [id number] Date of Birth: May 20, 1965 Attending MD: [name] ,  CSN: [id number] Age: 54 Admit Type: Outpatient Procedure:                Upper GI endoscopy Indications:              Suspected esophageal reflux, Wheezing and shortness                            of breath Providers:                [name], Jannett Celestine, RN, Sheran Fava, Crystal Page Referring MD:              Medicines:                Monitored Anesthesia Care Complications:            No immediate complications. Estimated Blood Loss:     Estimated blood loss: none. Procedure:                Pre-Anesthesia Assessment:                           - Prior to the procedure, a History and Physical                            was performed, and patient medications, allergies                            and sensitivities were reviewed. The patient's                            tolerance of previous anesthesia was reviewed.                           - The risks and benefits of the procedure and the                            sedation options and risks were discussed with the                            patient. All questions were answered and informed                            consent was obtained.                           - ASA Grade Assessment: II - A patient with mild                            systemic disease.                           After obtaining informed consent, the endoscope was  passed under direct vision. Throughout the                            procedure, the patient's blood pressure, pulse, and                            oxygen saturations were monitored continuously. The                            [id number]-[id number] ([id number]) scope was introduced through the                            mouth, and advanced to the second part of duodenum.                             The upper GI endoscopy was accomplished without                            difficulty. The patient tolerated the procedure                            well. Scope In: 9:35:26 AM Scope Out: 9:40:43 AM Total Procedure Duration: 0 hours 5 minutes 17 seconds  Findings:      The Z-line was regular and was found 36 cm from the incisors.      The examined esophagus was normal.      The entire examined stomach was normal.      The examined duodenum was normal. Impression:               - Z-line regular, 36 cm from the incisors.                           - Normal esophagus.                           - Normal stomach.                           - Normal examined duodenum.                           - No specimens collected. Moderate Sedation:      Per Anesthesia Care Recommendation:           - Discharge patient to home (ambulatory).                           - Resume previous diet.                           - Continue present medications. Procedure Code(s):        --- Professional ---                           [phone], GC, Esophagogastroduodenoscopy, flexible,  transoral; diagnostic, including collection of                            specimen(s) by brushing or washing, when performed                            (separate procedure) Diagnosis Code(s):        --- Professional ---                           R06.2, Wheezing CPT copyright 2019 American Medical Association. All rights reserved. The codes documented in this report are preliminary and upon coder review may  be revised to meet current compliance requirements. [name], MD [name],  12/24/2019 9:53:31 AM This report has been signed electronically. Number of Addenda: 0

## 2019-12-27 ENCOUNTER — Encounter (HOSPITAL_COMMUNITY): Payer: Self-pay | Admitting: Gastroenterology

## 2020-01-09 ENCOUNTER — Encounter: Payer: Self-pay | Admitting: Internal Medicine

## 2020-01-09 ENCOUNTER — Ambulatory Visit (INDEPENDENT_AMBULATORY_CARE_PROVIDER_SITE_OTHER): Payer: PRIVATE HEALTH INSURANCE | Admitting: Internal Medicine

## 2020-01-09 ENCOUNTER — Telehealth: Payer: Self-pay | Admitting: Internal Medicine

## 2020-01-09 ENCOUNTER — Other Ambulatory Visit: Payer: Self-pay

## 2020-01-09 DIAGNOSIS — J45991 Cough variant asthma: Secondary | ICD-10-CM | POA: Insufficient documentation

## 2020-01-09 LAB — CBC WITH DIFFERENTIAL/PLATELET
Basophils Absolute: 0 10*3/uL (ref 0.0–0.1)
Basophils Relative: 0.7 % (ref 0.0–3.0)
Eosinophils Absolute: 0.9 10*3/uL — ABNORMAL HIGH (ref 0.0–0.7)
Eosinophils Relative: 14 % — ABNORMAL HIGH (ref 0.0–5.0)
HCT: 41.2 % (ref 36.0–46.0)
Hemoglobin: 13.6 g/dL (ref 12.0–15.0)
Lymphocytes Relative: 37.5 % (ref 12.0–46.0)
Lymphs Abs: 2.4 10*3/uL (ref 0.7–4.0)
MCHC: 33 g/dL (ref 30.0–36.0)
MCV: 87.1 fl (ref 78.0–100.0)
Monocytes Absolute: 0.4 10*3/uL (ref 0.1–1.0)
Monocytes Relative: 6.6 % (ref 3.0–12.0)
Neutro Abs: 2.6 10*3/uL (ref 1.4–7.7)
Neutrophils Relative %: 41.2 % — ABNORMAL LOW (ref 43.0–77.0)
Platelets: 279 10*3/uL (ref 150.0–400.0)
RBC: 4.72 Mil/uL (ref 3.87–5.11)
RDW: 13.2 % (ref 11.5–15.5)
WBC: 6.3 10*3/uL (ref 4.0–10.5)

## 2020-01-09 MED ORDER — FLUCONAZOLE 100 MG PO TABS: 100.0000 mg | ORAL_TABLET | Freq: Every day | ORAL | 2 refills | Status: DC

## 2020-01-09 MED ORDER — ACETAMINOPHEN-CODEINE #3 300-30 MG PO TABS
1.0000 | ORAL_TABLET | ORAL | 0 refills | Status: AC | PRN
Start: 2020-01-09 — End: 2020-01-14

## 2020-01-09 MED ORDER — FAMOTIDINE 20 MG PO TABS
ORAL_TABLET | ORAL | Status: DC
Start: 2020-01-09 — End: 2020-02-11

## 2020-01-09 MED ORDER — ACETAMINOPHEN-CODEINE #3 300-30 MG PO TABS: 1.0000 | ORAL_TABLET | ORAL | 0 refills | Status: DC | PRN

## 2020-01-09 MED ORDER — AZITHROMYCIN 250 MG PO TABS: ORAL_TABLET | ORAL | 0 refills | Status: DC

## 2020-01-09 MED ORDER — BREZTRI AEROSPHERE 160-9-4.8 MCG/ACT IN AERO: 2.0000 | INHALATION_SPRAY | Freq: Two times a day (BID) | RESPIRATORY_TRACT | 0 refills | Status: DC

## 2020-01-09 MED ORDER — METHYLPREDNISOLONE ACETATE 80 MG/ML IJ SUSP
120.0000 mg | Freq: Once | INTRAMUSCULAR | Status: AC
  Administered 2020-01-09: 120 mg via INTRAMUSCULAR

## 2020-01-09 NOTE — Progress Notes (Signed)
Ardelle Park, female    DOB: 1965-05-02,  MRN: [id number]   Brief patient profile:  4 yowf with h/o UACS  Resolved on just pepcid 20 mg at hs since 2011 which was last pulmonary ov   then covid 19 05/2018 while in [place] achy/cough/dizzy/ anosmia recovered  100% (except maybe smell) then April/May2021 cough / sob > saw PCP, nl cxr and no rx stayed about the same and tried zyrtec no change and started nexium 40 mg around 1st sept 2021 helped throat clearing 25% then EGD 12/24/19  done by GI nl > continue nexium and referred to pulmonary clinic 01/09/2020 by Richarda Blade      History of Present Illness  01/09/2020  Pulmonary/ 1st office eval/Raylan Troiani  Chief Complaint  Patient presents with  . Pulmonary Consult    increased SOB x 4-5 months. She states she has to clear her throat often. She has a prod cough with very minimal yellow sputum. She states "feels like something stuck in my chest".  Dyspnea:with minimal activity  Cough: immediately at hs p zyrtec in am / slt yellow mucus  Sleep: bed blocks  SABA use:  Albuterol helps some last used 2 hours  prior to OV  Though baseline hfa technique quite poor  Cat x 17 years no new exp  Ex asthma as child  No obvious day to day or daytime variability or assoc mucus plugs or hemoptysis or cp or chest tightness, subjective wheeze or overt sinus or hb symptoms.     Also denies any obvious fluctuation of symptoms with weather or environmental changes or other aggravating or alleviating factors except as outlined above   No unusual exposure hx or h/o childhood pna  knowledge of premature birth.  Current Allergies, Complete Past Medical History, Past Surgical History, Family History, and Social History were reviewed in [employer] [facility] record.  ROS  The following are not active complaints unless bolded Hoarseness, sore throat, dysphagia, dental problems, itching, sneezing,  nasal congestion or discharge of excess mucus or  purulent secretions, ear ache,   fever, chills, sweats, unintended wt loss or wt gain, classically pleuritic or exertional cp,  orthopnea pnd or arm/hand swelling  or leg swelling, presyncope, palpitations, abdominal pain, anorexia, nausea, vomiting, diarrhea  or change in bowel habits or change in bladder habits, change in stools or change in urine, dysuria, hematuria,  rash, arthralgias, visual complaints, headache, numbness, weakness or ataxia or problems with walking or coordination,  change in mood or  memory.          Past Medical History:  Diagnosis Date  . Anemia   . GERD (gastroesophageal reflux disease)   . Headache   . Insomnia   . Iron deficiency     Outpatient Medications Prior to Visit  Medication Sig Dispense Refill  . ALPRAZolam (XANAX) 0.25 MG tablet Take 0.125 mg by mouth daily as needed for anxiety.     . Aspirin-Caffeine (ANACIN) 400-32 MG TABS Take 2 tablets by mouth daily as needed (headaches/pain).    . Biotin 41324 MCG TABS Take 10,000 mcg by mouth daily.    . Cholecalciferol (VITAMIN D) 50 MCG (2000 UT) tablet Take 2,000 Units by mouth daily.    . diphenhydrAMINE (BENADRYL) 25 mg capsule Take 25 mg by mouth at bedtime.     Marland Kitchen esomeprazole (NEXIUM) 40 MG capsule Take 1 capsule (40 mg total) by mouth daily before breakfast.  started early sept 2021    . famotidine (  PEPCID) 20 MG tablet Take 20 mg by mouth at bedtime.    . naproxen sodium (ALEVE) 220 MG tablet Take 220 mg by mouth daily as needed (headaches).    Marland Kitchen OVER THE COUNTER MEDICATION Take 1 tablet by mouth daily. Joint Flex otc supplement    . PARoxetine (PAXIL) 10 MG tablet Take 10 mg by mouth daily.        Objective:     BP 128/90 (BP Location: Left Arm, Cuff Size: Normal)   Pulse 88   Temp (!) 96.2 F (35.7 C) (Temporal)   Ht 5\' 2"  (1.575 m)   Wt 114 lb 3.2 oz (51.8 kg)   SpO2 97% Comment: on RA  BMI 20.89 kg/m   SpO2: 97 % (on RA)   Pleasant amb wf nad with marked pseudowheeze    HEENT : pt  wearing mask not removed for exam due to covid -19 concerns.    NECK :  without JVD/Nodes/TM/ nl carotid upstrokes bilaterally   LUNGS: no acc muscle use,  Nl contour chest with insp/exp wheeze bilaterally (vs transmitted from upper airway, hard to be sure)  without cough on insp or exp maneuvers   CV:  RRR  no s3 or murmur or increase in P2, and no edema   ABD:  soft and nontender with nl inspiratory excursion in the supine position. No bruits or organomegaly appreciated, bowel sounds nl  MS:  Nl gait/ ext warm without deformities, calf tenderness, cyanosis or clubbing No obvious joint restrictions   SKIN: warm and dry without lesions    NEURO:  alert, approp, nl sensorium with  no motor or cerebellar deficits apparent.   Labs ordered 01/09/2020  :  allergy profile       Assessment   Cough variant asthma with component uacs  Flared x 08/2019 - 01/09/2020  After extensive coaching inhaler device,  effectiveness =    75% from a baseline of 10% > start breztri 2bid x 2 weeks then return - Allergy profile 01/09/2020 >  Eos 0.9 /  IgE  Pending   DDX of  difficult airways management almost all start with A and  include Adherence, Ace Inhibitors, Acid Reflux, Active Sinus Disease, Alpha 1 Antitripsin deficiency, Anxiety masquerading as Airways dz,  ABPA,  Allergy(esp in young), Aspiration (esp in elderly), Adverse effects of meds,  Active smoking or vaping, A bunch of PE's (a small clot burden can't cause this syndrome unless there is already severe underlying pulm or vascular dz with poor reserve) plus two Bs  = Bronchiectasis and Beta blocker use..and one C= CHF   Adherence is always the initial "prime suspect" and is a multilayered concern that requires a "trust but verify" approach in every patient - starting with knowing how to use medications, especially inhalers, correctly, keeping up with refills and understanding the fundamental difference between maintenance and prns vs those  medications only taken for a very short course and then stopped and not refilled.  - see hfa teaching - return with all meds in hand using a trust but verify approach to confirm accurate Medication  Reconciliation The principal here is that until we are certain that the  patients are doing what we've asked, it makes no sense to ask them to do more.   ? Allergy > check IgE,  depomedrol 120 mg IM / high dose ics for now  I spent extra time with pt today reviewing appropriate use of albuterol for prn use on exertion with  the following points: 1) saba is for relief of sob that does not improve by walking a slower pace or resting but rather if the pt does not improve after trying this first. 2) If the pt is convinced, as many are, that saba helps recover from activity faster then it's easy to tell if this is the case by re-challenging : ie stop, take the inhaler, then p 5 minutes try the exact same activity (intensity of workload) that just caused the symptoms and see if they are substantially diminished or not after saba 3) if there is an activity that reproducibly causes the symptoms, try the saba 15 min before the activity on alternate days   If in fact the saba really does help, then fine to continue to use it prn but advised may need to look closer at the maintenance regimen being used to achieve better control of airways disease with exertion.   ? Acid (or non-acid) GERD > always difficult to exclude as up to 75% of pts in some series report no assoc GI/ Heartburn symptoms> rec max (24h)  acid suppression and diet restrictions/ reviewed and instructions given in writing.  -   Of the three most common causes of  Sub-acute / recurrent or chronic cough, only one (GERD)  can actually contribute to/ trigger  the other two (asthma and post nasal drip syndrome)  and perpetuate the cylce of cough.  While not intuitively obvious, many patients with chronic low grade reflux do not cough until there is a  primary insult that disturbs the protective epithelial barrier and exposes sensitive nerve endings.    >>> This is typically viral but can due to PNDS and  either may apply here.   The point is that once this occurs, it is difficult to eliminate the cycle  using anything but a maximally effective acid suppression regimen at least in the short run, accompanied by an appropriate diet to address non acid GERD and control / eliminate the cough itself for at least 3 days with delsym supplemented with tylenol #3   ? Active sinus dz > no obvious symptoms, hold off on sinus ct for now   ? ABPA > check IgE pending    ? Anxiety > usually at the bottom of this list of usual suspects but should be much higher on this pt's based on H and P and note already on psychotropics and may interfere with adherence and also interpretation of response or lack thereof to symptom management which can be quite subjective.           Each maintenance medication was reviewed in detail including emphasizing most importantly the difference between maintenance and prns and under what circumstances the prns are to be triggered using an action plan format where appropriate.  Total time for H and P, chart review, counseling, teaching device and generating customized AVS unique to this office visit / charting = 52 min           Sandrea Hughs, MD 01/09/2020

## 2020-01-09 NOTE — Telephone Encounter (Signed)
Spoke with the pt and notified of response per Dr Sherene Sires and she verbalized understanding. Rx already sent to pharm  Nothing further needed

## 2020-01-09 NOTE — Patient Instructions (Addendum)
Please remember to go to the lab department   for your tests - we will call you with the results when they are available.      breztri Take 2 puffs first thing in am and then another 2 puffs about 12 hours later.    Work on inhaler technique:  relax and gently blow all the way out then take a nice smooth deep breath back in, triggering the inhaler at same time you start breathing in.  Hold for up to 5 seconds if you can. Blow out thru nose. Rinse and gargle with water when done     Only use your albuterol as a rescue medication to be used if you can't catch your breath by resting or doing a relaxed purse lip breathing pattern.  - The less you use it, the better it will work when you need it. - Ok to use up to 2 puffs  every 4 hours if you must but call for immediate appointment if use goes up over your usual need - Don't leave home without it !!  (think of it like the spare tire for your car)    Take nexium Take 30-60 min before first meal of the day and take pepcid 40 mg at least on hour before bedtime   GERD (REFLUX)  is an extremely common cause of respiratory symptoms just like yours , many times with no obvious heartburn at all.    It can be treated with medication, but also with lifestyle changes including elevation of the head of your bed (ideally with 6 -8inch blocks under the headboard of your bed),  Smoking cessation, avoidance of late meals, excessive alcohol, and avoid fatty foods, chocolate, peppermint, colas, red wine, and acidic juices such as orange juice.  NO MINT OR MENTHOL PRODUCTS SO NO COUGH DROPS  USE SUGARLESS CANDY INSTEAD (Jolley ranchers or Stover's or Life Savers) or even ice chips will also do - the key is to swallow to prevent all throat clearing. NO OIL BASED VITAMINS - use powdered substitutes.  Avoid fish oil when coughing.  Depomedrol 120 mg IM   Please schedule a follow up office visit in 2weeks, sooner if needed  with all medications /inhalers/ solutions in  hand so we can verify exactly what you are taking. This includes all medications from all doctors and over the counters Add:  Take delsym two tsp every 12 hours and supplement if needed with  Tylenol #3   up to  1 every 4 hours to suppress the urge to cough. Swallowing water and/or using ice chips/non mint and menthol containing candies (such as lifesavers or sugarless jolly ranchers) are also effective.  You should rest your voice and avoid activities that you know make you cough.  Once you have eliminated the cough for 3 straight days try reducing the Tylenol #3 first,  then the delsym as tolerated.

## 2020-01-09 NOTE — Assessment & Plan Note (Signed)
Flared x 08/2019 - 01/09/2020  After extensive coaching inhaler device,  effectiveness =    75% from a baseline of 10% > start breztri 2bid x 2 weeks then return - Allergy profile 01/09/2020 >  Eos 0.9 /  IgE  Pending   DDX of  difficult airways management almost all start with A and  include Adherence, Ace Inhibitors, Acid Reflux, Active Sinus Disease, Alpha 1 Antitripsin deficiency, Anxiety masquerading as Airways dz,  ABPA,  Allergy(esp in young), Aspiration (esp in elderly), Adverse effects of meds,  Active smoking or vaping, A bunch of PE's (a small clot burden can't cause this syndrome unless there is already severe underlying pulm or vascular dz with poor reserve) plus two Bs  = Bronchiectasis and Beta blocker use..and one C= CHF   Adherence is always the initial "prime suspect" and is a multilayered concern that requires a "trust but verify" approach in every patient - starting with knowing how to use medications, especially inhalers, correctly, keeping up with refills and understanding the fundamental difference between maintenance and prns vs those medications only taken for a very short course and then stopped and not refilled.  - see hfa teaching - return with all meds in hand using a trust but verify approach to confirm accurate Medication  Reconciliation The principal here is that until we are certain that the  patients are doing what we've asked, it makes no sense to ask them to do more.   ? Allergy > check IgE,  depomedrol 120 mg IM / high dose ics for now  I spent extra time with pt today reviewing appropriate use of albuterol for prn use on exertion with the following points: 1) saba is for relief of sob that does not improve by walking a slower pace or resting but rather if the pt does not improve after trying this first. 2) If the pt is convinced, as many are, that saba helps recover from activity faster then it's easy to tell if this is the case by re-challenging : ie stop, take the  inhaler, then p 5 minutes try the exact same activity (intensity of workload) that just caused the symptoms and see if they are substantially diminished or not after saba 3) if there is an activity that reproducibly causes the symptoms, try the saba 15 min before the activity on alternate days   If in fact the saba really does help, then fine to continue to use it prn but advised may need to look closer at the maintenance regimen being used to achieve better control of airways disease with exertion.   ? Acid (or non-acid) GERD > always difficult to exclude as up to 75% of pts in some series report no assoc GI/ Heartburn symptoms> rec max (24h)  acid suppression and diet restrictions/ reviewed and instructions given in writing.  -   Of the three most common causes of  Sub-acute / recurrent or chronic cough, only one (GERD)  can actually contribute to/ trigger  the other two (asthma and post nasal drip syndrome)  and perpetuate the cylce of cough.  While not intuitively obvious, many patients with chronic low grade reflux do not cough until there is a primary insult that disturbs the protective epithelial barrier and exposes sensitive nerve endings.    >>> This is typically viral but can due to PNDS and  either may apply here.   The point is that once this occurs, it is difficult to eliminate the cycle  using anything but a maximally effective acid suppression regimen at least in the short run, accompanied by an appropriate diet to address non acid GERD and control / eliminate the cough itself for at least 3 days with delsym supplemented with tylenol #3   ? Active sinus dz > no obvious symptoms, hold off on sinus ct for now   ? ABPA > check IgE pending    ? Anxiety > usually at the bottom of this list of usual suspects but should be much higher on this pt's based on H and P and note already on psychotropics and may interfere with adherence and also interpretation of response or lack thereof to symptom  management which can be quite subjective.           Each maintenance medication was reviewed in detail including emphasizing most importantly the difference between maintenance and prns and under what circumstances the prns are to be triggered using an action plan format where appropriate.  Total time for H and P, chart review, counseling, teaching device and generating customized AVS unique to this office visit / charting = 52 min

## 2020-01-09 NOTE — Telephone Encounter (Signed)
Called in tyl # 3  Take delsym two tsp every 12 hours and supplement if needed with  Tylenol #3   up to 1 every 4 hours to suppress the urge to cough. Swallowing water and/or using ice chips/non mint and menthol containing candies (such as lifesavers or sugarless jolly ranchers) are also effective.  You should rest your voice and avoid activities that you know make you cough.  Once you have eliminated the cough for 3 straight days try reducing the Tylenol #3 first,  then the delsym as tolerated.

## 2020-01-09 NOTE — Telephone Encounter (Signed)
Pt just seen in clinic this am  Discussed with Dr Sherene Sires and will forward to him to send in cough syrup

## 2020-01-10 LAB — IGE: IgE (Immunoglobulin E), Serum: 46 kU/L (ref <=114)

## 2020-01-10 NOTE — Progress Notes (Signed)
LMTCB

## 2020-01-13 ENCOUNTER — Telehealth: Payer: Self-pay | Admitting: Internal Medicine

## 2020-01-13 NOTE — Telephone Encounter (Signed)
Spoke with patient and reviewed IgE results. Patient understanding. Nothing further needed at this time.

## 2020-01-14 NOTE — Progress Notes (Signed)
Pt was notified of results

## 2020-01-21 ENCOUNTER — Telehealth: Payer: Self-pay | Admitting: Internal Medicine

## 2020-01-21 NOTE — Telephone Encounter (Signed)
Call patient : Study is mixed result in that one test suggests allergy and the other doesn't > let's see what response we see with present rx and re-eval at f/u ? Need for formal allergy referral depending on progress in interim   Spoke with the pt and notified of results She will keep appt with Dr Sherene Sires on 01/23/20  Nothing further needed

## 2020-01-23 ENCOUNTER — Other Ambulatory Visit: Payer: Self-pay

## 2020-01-23 ENCOUNTER — Other Ambulatory Visit: Payer: Self-pay | Admitting: Internal Medicine

## 2020-01-23 ENCOUNTER — Ambulatory Visit (INDEPENDENT_AMBULATORY_CARE_PROVIDER_SITE_OTHER): Payer: PRIVATE HEALTH INSURANCE | Admitting: Internal Medicine

## 2020-01-23 ENCOUNTER — Encounter: Payer: Self-pay | Admitting: Internal Medicine

## 2020-01-23 ENCOUNTER — Ambulatory Visit (INDEPENDENT_AMBULATORY_CARE_PROVIDER_SITE_OTHER): Payer: PRIVATE HEALTH INSURANCE | Admitting: Gastroenterology

## 2020-01-23 ENCOUNTER — Ambulatory Visit: Payer: PRIVATE HEALTH INSURANCE | Admitting: Internal Medicine

## 2020-01-23 DIAGNOSIS — J479 Bronchiectasis, uncomplicated: Secondary | ICD-10-CM | POA: Diagnosis not present

## 2020-01-23 DIAGNOSIS — J45991 Cough variant asthma: Secondary | ICD-10-CM | POA: Diagnosis not present

## 2020-01-23 MED ORDER — AMOXICILLIN-POT CLAVULANATE 875-125 MG PO TABS: 1.0000 | ORAL_TABLET | Freq: Two times a day (BID) | ORAL | 0 refills | Status: AC

## 2020-01-23 MED ORDER — BUDESONIDE-FORMOTEROL FUMARATE 80-4.5 MCG/ACT IN AERO: INHALATION_SPRAY | RESPIRATORY_TRACT | 12 refills | Status: DC

## 2020-01-23 NOTE — Progress Notes (Signed)
Beth [name], female    DOB: 03-Jul-1965,  MRN: [id number]   Brief patient profile:  57 yowf never smoker with h/o UACS  Resolved on just pepcid 20 mg at hs since 2011 which was last pulmonary ov   then covid 19 05/2019 while in [place] achy/cough/dizzy/ anosmia recovered  100% (except maybe smell) then April/May2021 cough / sob > saw PCP, nl cxr and no rx stayed about the same and tried zyrtec no change and started nexium 40 mg around 1st sept 2021 helped throat clearing 25% then EGD 12/24/19 Dr Ramon's partner  done by GI nl > continue nexium and referred to pulmonary clinic 01/09/2020 by Richarda Blade      History of Present Illness  01/09/2020  Pulmonary/ 1st office eval/Beth [name]  Chief Complaint  Patient presents with  . Pulmonary Consult    increased SOB x 4-5 months. She states she has to clear her throat often. She has a prod cough with very minimal yellow sputum. She states "feels like something stuck in my chest".  Dyspnea:with minimal activity  Cough: immediately at hs p zyrtec in am / slt yellow mucus  Sleep: bed blocks  SABA use:  Albuterol helps some last used 2 hours  prior to OV  Though baseline hfa technique quite poor  Cat x 17 years no new exp  Ex asthma as child rec Please remember to go to the lab department   for your tests - we will call you with the results when they are available.  Breztri Take 2 puffs first thing in am and then another 2 puffs about 12 hours later.  Work on inhaler technique:   Only use your albuterol   Take nexium Take 30-60 min before first meal of the day and take pepcid 40 mg at least on hour before bedtime  GERD  Diet  Depomedrol 120 mg IM  Please schedule a follow up office visit in 2 weeks  Add:  Take delsym two tsp every 12 hours and supplement if needed with  Tylenol #3   up to  1 every 4 hours to suppress the urge to cough.  Once you have eliminated the cough for 3 straight days try reducing the Tylenol #3 first,  then the delsym  as tolerated.        01/23/2020  f/u ov/Beth [name] re: ? Bronchiectasis p covid  Chief Complaint  Patient presents with  . Follow-up    having hard time coughing up phlegm & its green when pt does  Dyspnea:  Better with activity but doe x hills x 30 min hike  Cough: much worse at hs with minimal thick green mucus production Sleeping: better prone / no better tylenol #3  SABA use: 4 x daily - never pre or rechallenges  02: none  Still throat clearing  Felt much better in mountains on 2 recent trips   No obvious day to day or daytime variability or assoc mucus plugs or hemoptysis or cp or chest tightness, subjective wheeze or overt   hb symptoms.    Also denies any obvious fluctuation of symptoms with weather or environmental changes or other aggravating or alleviating factors except as outlined above   No unusual exposure hx or h/o childhood pna/ asthma or knowledge of premature birth.  Current Allergies, Complete Past Medical History, Past Surgical History, Family History, and Social History were reviewed in [employer] record.  ROS  The following are not active complaints unless bolded Hoarseness,  sore throat, dysphagia, dental problems, itching, sneezing,  nasal congestion or discharge of excess mucus or purulent secretions, ear ache,   fever, chills, sweats, unintended wt loss or wt gain, classically pleuritic or exertional cp,  orthopnea pnd or arm/hand swelling  or leg swelling, presyncope, palpitations, abdominal pain, anorexia, nausea, vomiting, diarrhea  or change in bowel habits or change in bladder habits, change in stools or change in urine, dysuria, hematuria,  rash, arthralgias, visual complaints, headache, numbness, weakness or ataxia or problems with walking or coordination,  change in mood or  memory.        Current Meds  Medication Sig  . albuterol (VENTOLIN HFA) 108 (90 Base) MCG/ACT inhaler Inhale 2 puffs into the lungs every 6 (six) hours as needed  for wheezing or shortness of breath.  . ALPRAZolam (XANAX) 0.25 MG tablet Take 0.125 mg by mouth daily as needed for anxiety.   . Aspirin-Caffeine (ANACIN) 400-32 MG TABS Take 2 tablets by mouth daily as needed (headaches/pain).  . Biotin 16109 MCG TABS Take 10,000 mcg by mouth daily.  . Budeson-Glycopyrrol-Formoterol (BREZTRI AEROSPHERE) 160-9-4.8 MCG/ACT AERO Inhale 2 puffs into the lungs 2 (two) times daily.  . Cholecalciferol (VITAMIN D) 50 MCG (2000 UT) tablet Take 2,000 Units by mouth daily.  Marland Kitchen esomeprazole (NEXIUM) 40 MG capsule Take 1 capsule (40 mg total) by mouth daily before breakfast.  . famotidine (PEPCID) 20 MG tablet Take 2 x one hour before bed  . fluconazole (DIFLUCAN) 100 MG tablet Take 1 tablet (100 mg total) by mouth daily.  . naproxen sodium (ALEVE) 220 MG tablet Take 220 mg by mouth daily as needed (headaches).  Marland Kitchen OVER THE COUNTER MEDICATION Take 1 tablet by mouth daily. Joint Flex otc supplement  . PARoxetine (PAXIL) 10 MG tablet Take 10 mg by mouth daily.                  Past Medical History:  Diagnosis Date  . Anemia   . GERD (gastroesophageal reflux disease)   . Headache   . Insomnia   . Iron deficiency        Objective:     Wt Readings from Last 3 Encounters:  01/23/20 114 lb 9.6 oz (52 kg)  01/09/20 114 lb 3.2 oz (51.8 kg)  12/05/19 116 lb 1.6 oz (52.7 kg)     Vital signs reviewed - Note on arrival 01/23/2020  02 sats  96% on RA      Pleasant amb wf nad with harsh sounding cough/rattling much better with plm    HEENT : pt wearing mask not removed for exam due to covid -19 concerns.    NECK :  without JVD/Nodes/TM/ nl carotid upstrokes bilaterally   LUNGS: no acc muscle use,  Nl contour chest with minimal insp/exp rhonchi  bilaterally without cough on insp or exp maneuvers   CV:  RRR  no s3 or murmur or increase in P2, and no edema   ABD:  soft and nontender with nl inspiratory excursion in the supine position. No bruits or organomegaly  appreciated, bowel sounds nl  MS:  Nl gait/ ext warm without deformities, calf tenderness, cyanosis or clubbing No obvious joint restrictions   SKIN: warm and dry without lesions    NEURO:  alert, approp, nl sensorium with  no motor or cerebellar deficits apparent.              Assessment

## 2020-01-23 NOTE — Patient Instructions (Addendum)
mucinex dm 1200 mg every 12 hours and use the flutter valve up as much as tylenol #3 up to 2 at bedtime - sleep prone  Change breztri to symbicort 80 Take 2 puffs first thing in am and then another 2 puffs about 12 hours later.   Work on inhaler technique:  relax and gently blow all the way out then take a nice smooth deep breath back in, triggering the inhaler at same time you start breathing in.  Hold for up to 5 seconds if you can. Blow out thru nose. Rinse and gargle with water when done   Augmentin 875 mg take one pill twice daily  X 10 days - take at breakfast and supper with large glass of water.  It would help reduce the usual side effects (diarrhea and yeast infections) if you ate cultured yogurt at lunch.    We will set up a ct chest/ sinus in 2 weeks and office visit same day here or [place]

## 2020-01-24 ENCOUNTER — Encounter: Payer: Self-pay | Admitting: Internal Medicine

## 2020-01-24 NOTE — Assessment & Plan Note (Signed)
Flared x 08/2019 - 01/09/2020   start breztri 2bid x 2 weeks then return - Allergy profile 01/09/2020 >  Eos 0.9 /  IgE  46 - 01/23/2020  After extensive coaching inhaler device,  effectiveness =    75% > change to symb 80 2bid as breathing better but not the cough ? Bronchiectasis

## 2020-01-24 NOTE — Assessment & Plan Note (Signed)
Onset of refractory cough April 2021  - flutter valve/ prone position sleeping rec 01/23/2020  HRCT chest ordered 01/23/2020  Sinus CT ordered 01/23/2020   The rattling she does when coughing that improves quite a bit, along with the pseudowheeze,  Are typical of bronciectasis, tracheomalacia and sinus dz so rec scans p 10 days of augmentin   Discussed in detail all the  indications, usual  risks and alternatives  relative to the benefits with patient who agrees to proceed with w/u as outlined.            Each maintenance medication was reviewed in detail including emphasizing most importantly the difference between maintenance and prns and under what circumstances the prns are to be triggered using an action plan format where appropriate.  Total time for H and P, chart review, counseling, teaching device and generating customized AVS unique to this office visit / charting = 20 min

## 2020-01-30 ENCOUNTER — Ambulatory Visit (INDEPENDENT_AMBULATORY_CARE_PROVIDER_SITE_OTHER): Payer: PRIVATE HEALTH INSURANCE | Admitting: Gastroenterology

## 2020-01-30 ENCOUNTER — Telehealth: Payer: Self-pay | Admitting: Internal Medicine

## 2020-01-30 MED ORDER — METHYLPREDNISOLONE 4 MG PO TBPK: ORAL_TABLET | ORAL | 0 refills | Status: DC

## 2020-01-30 NOTE — Telephone Encounter (Signed)
LMTC x `1 for pt 

## 2020-01-30 NOTE — Telephone Encounter (Signed)
I really don't - I did the best I could at ov and if following instructions on avs to the letter then  only thing I might add is medrol 4 mg take 4-3-2-1 ( I am aware pred intol) but really best to be seen there or here or return - be sure using flutter with each cough to prevent airway trauma from the cough itself and ok to use tyl #3 daytime if mucinex dm by itself not effective

## 2020-01-30 NOTE — Telephone Encounter (Signed)
Spoke with pt, c/o worsening chest congestion, prod cough with small amounts of thick green mucus, shortness of breath Xfew days.  Denies fever, chest pains, headache, loss of taste/smell, increased fatigue.   Pt prescribed Augmentin X10 days at OV on 10/14, which has not changed her s/s.  Pt was also changed from [place] to Symbicort at this visit.  Pt is also taking mucinex BID, tylenol with codeine qhs, and using albuterol approx BID at this time.  Requesting additional recs.    Pt has CT sinus and Chest 10/26.    [facility] [place] (pt is currently out of town visiting daughter- pharmacy has been loaded into pt chart).    MW please advise on recs- thanks!

## 2020-01-30 NOTE — Telephone Encounter (Signed)
Spoke with the pt  Made aware of recs per MW  She verbalized understanding  Rx for medrol sent and will call for appt when she returns

## 2020-01-30 NOTE — Telephone Encounter (Signed)
Pt returning call.  [phone]

## 2020-02-03 ENCOUNTER — Ambulatory Visit: Payer: PRIVATE HEALTH INSURANCE | Admitting: Internal Medicine

## 2020-02-04 ENCOUNTER — Other Ambulatory Visit: Payer: Self-pay

## 2020-02-04 ENCOUNTER — Ambulatory Visit (INDEPENDENT_AMBULATORY_CARE_PROVIDER_SITE_OTHER)
Admission: RE | Admit: 2020-02-04 | Discharge: 2020-02-04 | Disposition: A | Discharge status: Home / Self Care | Payer: PRIVATE HEALTH INSURANCE | Source: Ambulatory Visit | Attending: Internal Medicine | Admitting: Internal Medicine

## 2020-02-04 DIAGNOSIS — J479 Bronchiectasis, uncomplicated: Secondary | ICD-10-CM

## 2020-02-05 NOTE — Progress Notes (Signed)
Spoke with pt and notified of results per Dr. Wert. Pt verbalized understanding and denied any questions. 

## 2020-02-06 ENCOUNTER — Encounter: Payer: Self-pay | Admitting: Internal Medicine

## 2020-02-06 ENCOUNTER — Ambulatory Visit (INDEPENDENT_AMBULATORY_CARE_PROVIDER_SITE_OTHER): Payer: PRIVATE HEALTH INSURANCE | Admitting: Internal Medicine

## 2020-02-06 ENCOUNTER — Ambulatory Visit (INDEPENDENT_AMBULATORY_CARE_PROVIDER_SITE_OTHER): Payer: PRIVATE HEALTH INSURANCE | Admitting: Gastroenterology

## 2020-02-06 ENCOUNTER — Other Ambulatory Visit: Payer: Self-pay

## 2020-02-06 DIAGNOSIS — J45991 Cough variant asthma: Secondary | ICD-10-CM

## 2020-02-06 DIAGNOSIS — J479 Bronchiectasis, uncomplicated: Secondary | ICD-10-CM

## 2020-02-06 NOTE — Progress Notes (Signed)
Beth [name], female    DOB: [birth year],  MRN: [id number]   Brief patient profile:  1 yowf never smoker with h/o UACS  Resolved on just pepcid 20 mg at hs since 2011 which was last pulmonary ov   then covid 19 05/2019 while in [place] achy/cough/dizzy/ anosmia recovered  100% (except maybe smell) then April/May2021 cough / sob > saw PCP, nl cxr and no rx stayed about the same and tried zyrtec no change and started nexium 40 mg around 1st sept 2021 helped throat clearing 25% then EGD 12/24/19 Dr Ramon's partner  done by GI nl > continue nexium and referred to pulmonary clinic 01/09/2020 by Richarda Blade      History of Present Illness  01/09/2020  Pulmonary/ 1st office eval/Beth [name]  Chief Complaint  Patient presents with  . Pulmonary Consult    increased SOB x 4-5 months. She states she has to clear her throat often. She has a prod cough with very minimal yellow sputum. She states "feels like something stuck in my chest".  Dyspnea:with minimal activity  Cough: immediately at hs p zyrtec in am / slt yellow mucus  Sleep: bed blocks  SABA use:  Albuterol helps some last used 2 hours  prior to OV  Though baseline hfa technique quite poor  Cat x 17 years no new exp  Ex asthma as child rec Please remember to go to the lab department   for your tests - we will call you with the results when they are available.  Breztri Take 2 puffs first thing in am and then another 2 puffs about 12 hours later.  Work on inhaler technique:   Only use your albuterol   Take nexium Take 30-60 min before first meal of the day and take pepcid 40 mg at least on hour before bedtime  GERD  Diet  Depomedrol 120 mg IM  Please schedule a follow up office visit in 2 weeks  Add:  Take delsym two tsp every 12 hours and supplement if needed with  Tylenol #3   up to  1 every 4 hours to suppress the urge to cough.  Once you have eliminated the cough for 3 straight days try reducing the Tylenol #3 first,  then the delsym  as tolerated.        01/23/2020  f/u ov/Beth [name] re: ? Bronchiectasis p covid  Chief Complaint  Patient presents with  . Follow-up    having hard time coughing up phlegm & its green when pt does  Dyspnea:  Better with activity but doe x hills x 30 min hike  Cough: much worse at hs with minimal thick green mucus production Sleeping: better prone / no better tylenol #3  SABA use: 4 x daily - never pre or rechallenges  02: none  Still throat clearing  [place] recent [place] rec mucinex dm 1200 mg every 12 hours and use the flutter valve up as much as tylenol #3 up to 2 at bedtime - sleep prone Change breztri to symbicort 80 Take 2 puffs first thing in am and then another 2 puffs about 12 hours later.  Work on inhaler technique  Augmentin 875 mg take one pill twice daily  X 10 days  We will set up a ct chest/ sinus in 2 weeks and office visit same day here or [place]    Virtual Visit via Telephone Note 02/06/2020  Re new dx Bronchiectasis   I connected with  Beth [name] on 02/06/20 at  915 AM EDT by telephone and verified that I am speaking with the correct person using two identifiers. Pt is at home and this call made from my office with no other participants    I discussed the limitations, risks, security and privacy concerns of performing an evaluation and management service by telephone and the availability of in person appointments. I also discussed with the patient that there may be a patient responsible charge related to this service. The patient expressed understanding and agreed to proceed.   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    Also denies any obvious fluctuation of symptoms with weather or environmental changes or other aggravating or alleviating factors except as outlined above.   Meds reviewed/ med reconciliation completed     No outpatient medications have been marked as taking for the 02/06/20 encounter (Appointment) with Nyoka Cowden, MD.         Observations/Objective: Sounds great on the phone no increased wob, no spont cough, good voice texture      Assessment and Plan: See problem list for active a/p's   Follow Up Instructions: See avs for instructions unique to this ov which includes revised/ updated med list     I discussed the assessment and treatment plan with the patient. The patient was provided an opportunity to ask questions and all were answered. The patient agreed with the plan and demonstrated an understanding of the instructions.   The patient was advised to call back or seek an in-person evaluation if the symptoms worsen or if the condition fails to improve as anticipated.  I provided 25 minutes of non-face-to-face time during this encounter.   Sandrea Hughs, MD                    Past Medical History:  Diagnosis Date  . Anemia   . GERD (gastroesophageal reflux disease)   . Headache   . Insomnia   . Iron deficiency

## 2020-02-06 NOTE — Patient Instructions (Signed)
Bronchiectasis =   you have scarring of your bronchial tubes which means that they don't function perfectly normally and mucus tends to pool in certain areas of your lung which can cause pneumonia and further scarring of your lung and bronchial tubes  Whenever you develop cough congestion take mucinex or mucinex dm > these will help keep the mucus loose and flowing but if your condition worsens you need to seek help immediately preferably here or somewhere inside the [facility] system to compare xrays ( worse = darker or bloody mucus or pain on breathing in)     No change in medications     Keep your follow up for November and we'll do additional blood tests then

## 2020-02-06 NOTE — Assessment & Plan Note (Signed)
Flared x 08/2019 - 01/09/2020   start breztri 2bid x 2 weeks then return - Allergy profile 01/09/2020 >  Eos 0.9 /  IgE  46 - 01/23/2020  After extensive coaching inhaler device,  effectiveness =    75% > change to symb 80 2bid as breathing better but not the cough ? Bronchiectasis  > see sep a/p   All goals of chronic asthma control met including optimal function and elimination of symptoms with minimal need for rescue therapy.  Contingencies discussed in full including contacting this office immediately if not controlling the symptoms using the rule of two's.

## 2020-02-06 NOTE — Assessment & Plan Note (Signed)
Onset of refractory cough April 2021  - flutter valve/ prone position sleeping rec 01/23/2020  - Sinus CT 02/04/2020 >>> neg - HRCT chest 10/262021 >>> mild bronchiectasis with micronodules - 02/06/2020 pt reported 90% improvement on symbicort 80 2bid plus flutter p augmentin x 10 days   Discussion of bronchiectasis using escalator analogy   F/u in Nov with alpha one and Quant Ig's needed then along with baseline Quantiferon Gold TB    Each maintenance medication was reviewed in detail including most importantly the difference between maintenance and as needed and under what circumstances the prns are to be used.  Please see AVS for specific  Instructions which are unique to this visit and I personally typed out  which were reviewed in detail over the phone with the patient and a copy provided via regular mail

## 2020-02-07 ENCOUNTER — Ambulatory Visit: Payer: PRIVATE HEALTH INSURANCE | Admitting: Internal Medicine

## 2020-02-11 ENCOUNTER — Encounter: Payer: Self-pay | Admitting: Internal Medicine

## 2020-02-11 ENCOUNTER — Telehealth: Payer: Self-pay | Admitting: Internal Medicine

## 2020-02-11 ENCOUNTER — Other Ambulatory Visit (HOSPITAL_COMMUNITY)
Admission: RE | Admit: 2020-02-11 | Discharge: 2020-02-11 | Disposition: A | Discharge status: Home / Self Care | Payer: No Typology Code available for payment source | Source: Ambulatory Visit | Attending: Internal Medicine | Admitting: Internal Medicine

## 2020-02-11 ENCOUNTER — Ambulatory Visit (INDEPENDENT_AMBULATORY_CARE_PROVIDER_SITE_OTHER): Payer: No Typology Code available for payment source | Admitting: Internal Medicine

## 2020-02-11 ENCOUNTER — Other Ambulatory Visit: Payer: Self-pay

## 2020-02-11 ENCOUNTER — Other Ambulatory Visit: Payer: Self-pay | Admitting: Internal Medicine

## 2020-02-11 DIAGNOSIS — J479 Bronchiectasis, uncomplicated: Secondary | ICD-10-CM

## 2020-02-11 DIAGNOSIS — J45991 Cough variant asthma: Secondary | ICD-10-CM

## 2020-02-11 MED ORDER — METHYLPREDNISOLONE ACETATE 80 MG/ML IJ SUSP
120.0000 mg | Freq: Once | INTRAMUSCULAR | Status: AC
  Administered 2020-02-11: 120 mg via INTRAMUSCULAR

## 2020-02-11 MED ORDER — FAMOTIDINE 20 MG PO TABS: ORAL_TABLET | ORAL | 11 refills | Status: AC

## 2020-02-11 MED ORDER — METHYLPREDNISOLONE 4 MG PO TBPK
ORAL_TABLET | ORAL | 0 refills | Status: DC
Start: 2020-02-11 — End: 2020-02-12

## 2020-02-11 NOTE — Telephone Encounter (Signed)
Spoke with pt. She is now wanting to keep her appointment the way it is. Pt has to attend a funeral today. Nothing further was needed.

## 2020-02-11 NOTE — Assessment & Plan Note (Addendum)
Onset of refractory cough April 2021  - flutter valve/ prone position sleeping rec 01/23/2020  - Sinus CT 02/04/2020 >>> neg - HRCT chest 10/262021 >>> mild bronchiectasis with micronodules - 02/06/2020 pt reported 90% improvement on symbicort 80 2bid plus flutter p augmentin x 10 days   No change rx = symbicort 80 2bid and prn mucinex dm         Each maintenance medication was reviewed in detail including emphasizing most importantly the difference between maintenance and prns and under what circumstances the prns are to be triggered using an action plan format where appropriate.  Total time for H and P, chart review, counseling, reviewing hfa  device and generating customized AVS unique to this office visit / charting = 30 min

## 2020-02-11 NOTE — Progress Notes (Signed)
Beth [name], female    DOB: [birth year],  MRN: [id number]   Brief patient profile:  55 yowf never smoker with h/o UACS  Resolved on just pepcid 20 mg at hs since 2011 which was last pulmonary ov   then covid 19 05/2019 while in [place] achy/cough/dizzy/ anosmia recovered  100% (except maybe smell) then April/May2021 cough / sob > saw PCP, nl cxr and no rx stayed about the same and tried zyrtec no change and started nexium 40 mg around 1st sept 2021 helped throat clearing 25% then EGD 12/24/19 Dr Ramon's partner  done by GI nl > continue nexium and referred to pulmonary clinic 01/09/2020 by Richarda Blade      History of Present Illness  01/09/2020  Pulmonary/ 1st office eval/Lache Dagher  Chief Complaint  Patient presents with  . Pulmonary Consult    increased SOB x 4-5 months. She states she has to clear her throat often. She has a prod cough with very minimal yellow sputum. She states "feels like something stuck in my chest".  Dyspnea:with minimal activity  Cough: immediately at hs p zyrtec in am / slt yellow mucus  Sleep: bed blocks  SABA use:  Albuterol helps some last used 2 hours  prior to OV  Though baseline hfa technique quite poor  Cat x 17 years no new exp  Ex asthma as child rec Please remember to go to the lab department   for your tests - we will call you with the results when they are available.  Breztri Take 2 puffs first thing in am and then another 2 puffs about 12 hours later.  Work on inhaler technique:   Only use your albuterol   Take nexium Take 30-60 min before first meal of the day and take pepcid 40 mg at least on hour before bedtime  GERD  Diet  Depomedrol 120 mg IM  Please schedule a follow up office visit in 2 weeks  Add:  Take delsym two tsp every 12 hours and supplement if needed with  Tylenol #3   up to  1 every 4 hours to suppress the urge to cough.  Once you have eliminated the cough for 3 straight days try reducing the Tylenol #3 first,  then the delsym  as tolerated.        01/23/2020  f/u ov/Bulah Lurie re: ? Bronchiectasis p covid  Chief Complaint  Patient presents with  . Follow-up    having hard time coughing up phlegm & its green when pt does  Dyspnea:  Better with activity but doe x hills x 30 min hike  Cough: much worse at hs with minimal thick green mucus production Sleeping: better prone / no better tylenol #3  SABA use: 4 x daily - never pre or rechallenges  02: none  Still throat clearing  [place] much [place] 2 recent trips  rec mucinex dm 1200 mg every 12 hours and use the flutter valve up as much as tylenol #3 up to 2 at bedtime - sleep prone Change breztri to symbicort 80 Take 2 puffs first thing in am and then another 2 puffs about 12 hours later.  Work on inhaler technique:  relax and gently blow all the way out then take a nice smooth deep breath back in, triggering the inhaler at same time you start breathing in.  Hold for up to 5 seconds if you can. Blow out thru nose. Rinse and gargle with water when done Augmentin 875  mg take one pill twice daily  X 10 days    01/30/20  Medrol x 6 days   02/06/20  televisit No change rx   02/11/2020  f/u ov/[place] office/Akshara Blumenthal re: bronchiectasis/ started nexium 3 week prior to OV  On symb 80 2bid then pruritic ash one week prior to OV   Chief Complaint  Patient presents with  . Follow-up    rash on back of legs, elbows, back of arms, feels like hairball throat  onset of globus x 4 days and "tastes mucus " but nothing comes up Dyspnea: not aerobic but not limited by doe   Cough: mostly throat clearing  Sleeping: not sleeping well but no resp c/o's SABA use: none  02: none    No obvious day to day or daytime variability or assoc excess/ purulent sputum or mucus plugs or hemoptysis or cp or chest tightness, subjective wheeze or overt sinus or hb symptoms.   Sleeping  without nocturnal  or early am exacerbation  of respiratory  c/o's or need for noct saba. Also  denies any obvious fluctuation of symptoms with weather or environmental changes or other aggravating or alleviating factors except as outlined above   No unusual exposure hx or h/o childhood pna/ asthma or knowledge of premature birth.  Current Allergies, Complete Past Medical History, Past Surgical History, Family History, and Social History were reviewed in [employer] record.  ROS  The following are not active complaints unless bolded Hoarseness, sore throat, dysphagia, dental problems, itching, sneezing,  nasal congestion or discharge of excess mucus or purulent secretions, ear ache,   fever, chills, sweats, unintended wt loss or wt gain, classically pleuritic or exertional cp,  orthopnea pnd or arm/hand swelling  or leg swelling, presyncope, palpitations, abdominal pain, anorexia, nausea, vomiting, diarrhea  or change in bowel habits or change in bladder habits, change in stools or change in urine, dysuria, hematuria,  rash, arthralgias, visual complaints, headache, numbness, weakness or ataxia or problems with walking or coordination,  change in mood or  memory.        Current Meds  Medication Sig  . albuterol (VENTOLIN HFA) 108 (90 Base) MCG/ACT inhaler Inhale 2 puffs into the lungs every 6 (six) hours as needed for wheezing or shortness of breath.  . ALPRAZolam (XANAX) 0.25 MG tablet Take 0.125 mg by mouth daily as needed for anxiety.   . Aspirin-Caffeine (ANACIN) 400-32 MG TABS Take 2 tablets by mouth daily as needed (headaches/pain).  . Biotin 27782 MCG TABS Take 10,000 mcg by mouth daily.  . budesonide-formoterol (SYMBICORT) 80-4.5 MCG/ACT inhaler Take 2 puffs first thing in am and then another 2 puffs about 12 hours later.  . Cholecalciferol (VITAMIN D) 50 MCG (2000 UT) tablet Take 2,000 Units by mouth daily.  Marland Kitchen esomeprazole (NEXIUM) 40 MG capsule Take 1 capsule (40 mg total) by mouth daily before breakfast.  . famotidine (PEPCID) 20 MG tablet Take 2 x one hour  before bed  . fluconazole (DIFLUCAN) 100 MG tablet Take 1 tablet (100 mg total) by mouth daily.  . naproxen sodium (ALEVE) 220 MG tablet Take 220 mg by mouth daily as needed (headaches).  Marland Kitchen OVER THE COUNTER MEDICATION Take 1 tablet by mouth daily. Joint Flex otc supplement  . PARoxetine (PAXIL) 10 MG tablet Take 10 mg by mouth daily.                      Past Medical History:  Diagnosis Date  .  Anemia   . GERD (gastroesophageal reflux disease)   . Headache   . Insomnia   . Iron deficiency        Objective:     02/11/2020       113   01/23/20 114 lb 9.6 oz (52 kg)  01/09/20 114 lb 3.2 oz (51.8 kg)  12/05/19 116 lb 1.6 oz (52.7 kg)      Vital signs reviewed  02/11/2020  - Note at rest 02 sats  99% on RA      Pleasant amb wf nad freq throat clearing, dry sounding   HEENT : pt wearing mask not removed for exam due to covid -19 concerns.    NECK :  without JVD/Nodes/TM/ nl carotid upstrokes bilaterally   LUNGS: no acc muscle use,  Nl contour chest which is clear to A and P bilaterally without cough on insp or exp maneuvers   CV:  RRR  no s3 or murmur or increase in P2, and no edema   ABD:  soft and nontender with nl inspiratory excursion in the supine position. No bruits or organomegaly appreciated, bowel sounds nl  MS:  Nl gait/ ext warm without deformities, calf tenderness, cyanosis or clubbing No obvious joint restrictions   SKIN: warm and dry with dry scaly rash both elbows and macular splotchy hyperpigmented rash both post thigh/ calves   NEURO:  alert, approp, nl sensorium with  no motor or cerebellar deficits apparent.                    Assessment

## 2020-02-11 NOTE — Patient Instructions (Addendum)
For drainage / throat tickle try take CHLORPHENIRAMINE  4 mg  (Chlortab 4mg   at should be easiest to find in the green box)  take one every 4 hours as needed - available over the counter- may cause drowsiness so start with just a bedtime dose of two and see how you tolerate it before trying in daytime    Medrol repeat   depomedrol 120 mg IM   Stop nexium and take pepcid 20 mg one after meals and bedtime   See Dr hall re rash  Please remember to go to the lab department @ [facility] for your tests - we will call you with the results when they are available.       Keep previous appts

## 2020-02-11 NOTE — Assessment & Plan Note (Addendum)
Flared x 08/2019 - 01/09/2020   start breztri 2bid x 2 weeks then return - Allergy profile 01/09/2020 >  Eos 0.9 /  IgE  46 - 01/23/2020  After extensive coaching inhaler device,  effectiveness =    75% > change to symb 80 2bid as breathing better but not the cough ? Bronchiectasis  > see sep a/p  Flare with upper than lower airway symptoms on symb 80 2bid rx depomedrol 120 mg IM and medrol taper and off ppi (no gerd by EGD and ? Drug reaction on nexium) > change pepcid to 20 mg bid and added 1st gen H1 blockers per guidelines

## 2020-02-12 ENCOUNTER — Ambulatory Visit: Payer: PRIVATE HEALTH INSURANCE | Admitting: Internal Medicine

## 2020-02-12 LAB — ALPHA-1 ANTITRYPSIN PHENOTYPE: A-1 Antitrypsin, Ser: 109 mg/dL (ref 101–187)

## 2020-02-12 LAB — IGG, IGA, IGM
IgA: 182 mg/dL (ref 87–352)
IgG (Immunoglobin G), Serum: 895 mg/dL (ref 586–1602)
IgM (Immunoglobulin M), Srm: 106 mg/dL (ref 26–217)

## 2020-02-12 NOTE — Telephone Encounter (Signed)
Changes Requested   Name from pharmacy: METHYLPREDNISOLONE 4 MG DOSEPK       Will file in chart as: methylPREDNISolone (MEDROL DOSEPAK) 4 MG TBPK tablet   Sig: 4 x 1 day, 3 x 1 day, 2 x 1 day, 1 x 1 day and stop   Disp:  21 each  Refills:  0   Start: 02/11/2020   Class: Normal   Non-formulary   Last ordered: Yesterday by Nyoka Cowden, MD Last refill: 02/11/2020   Rx #: [id number]   Pharmacy comment: Script Clarification:DOSE PACK FOR 6 DAYS OR 4 DAY TAPER?     [facility] at: [id number] [place], [place] - [address]

## 2020-02-13 ENCOUNTER — Telehealth: Payer: Self-pay | Admitting: Internal Medicine

## 2020-02-13 LAB — IGE: IgE (Immunoglobulin E), Serum: 203 IU/mL (ref 6–495)

## 2020-02-13 NOTE — Telephone Encounter (Signed)
Called spoke with patient , she states she spoke with Beth [name] early today and Beth [name] was able to clear everything up. Nothing further needed at this time.

## 2020-02-17 NOTE — Progress Notes (Signed)
Called and went over with patient lab results per Dr Sherene Sires. All questions answered and patient expressed full understanding. Confirmed upcoming scheduled office visit with Dr Sherene Sires for 02/26/2020 at 2pm at the [place] office. Nothing further needed at this time.

## 2020-02-21 ENCOUNTER — Telehealth (INDEPENDENT_AMBULATORY_CARE_PROVIDER_SITE_OTHER): Payer: Self-pay | Admitting: Gastroenterology

## 2020-02-21 NOTE — Telephone Encounter (Signed)
Patient called to reschedule her appointment - also wanted you to know Dr Margo Aye (dermatologist) thinks she is allergic to Nexium so she has stopped taking it

## 2020-02-21 NOTE — Telephone Encounter (Signed)
Thanks, noted.  [name], MD Gastroenterology and Hepatology [facility]

## 2020-02-24 ENCOUNTER — Ambulatory Visit: Payer: PRIVATE HEALTH INSURANCE | Admitting: Internal Medicine

## 2020-02-26 ENCOUNTER — Ambulatory Visit: Payer: No Typology Code available for payment source | Admitting: Internal Medicine

## 2020-02-26 ENCOUNTER — Other Ambulatory Visit: Payer: Self-pay

## 2020-02-26 ENCOUNTER — Ambulatory Visit (INDEPENDENT_AMBULATORY_CARE_PROVIDER_SITE_OTHER): Payer: No Typology Code available for payment source | Admitting: Internal Medicine

## 2020-02-26 ENCOUNTER — Encounter: Payer: Self-pay | Admitting: Internal Medicine

## 2020-02-26 DIAGNOSIS — J479 Bronchiectasis, uncomplicated: Secondary | ICD-10-CM | POA: Diagnosis not present

## 2020-02-26 DIAGNOSIS — J45991 Cough variant asthma: Secondary | ICD-10-CM | POA: Diagnosis not present

## 2020-02-26 MED ORDER — AMOXICILLIN-POT CLAVULANATE 875-125 MG PO TABS: 1.0000 | ORAL_TABLET | Freq: Two times a day (BID) | ORAL | 11 refills | Status: AC

## 2020-02-26 MED ORDER — ACETAMINOPHEN-CODEINE #3 300-30 MG PO TABS
1.0000 | ORAL_TABLET | ORAL | 0 refills | Status: AC | PRN
Start: 2020-02-26 — End: 2020-03-02

## 2020-02-26 NOTE — Assessment & Plan Note (Signed)
Flared x 08/2019 p covid 05/2019  - 01/09/2020   start breztri 2bid x 2 weeks then return - Allergy profile 01/09/2020 >  Eos 0.9 /  IgE  46 then repeat level  203 on 02/11/20 with active rash - 01/23/2020  After extensive coaching inhaler device,  effectiveness =    75% > change to symb 80 2bid as breathing better but not the cough ? Bronchiectasis  > see sep a/p - 02/26/2020  After extensive coaching inhaler device,  effectiveness =    90%> continue symb 80 2bid   - allergy referral 02/26/2020   The refractory rash, elevated eos and new IgE spike are all suggestive of allegry but probably not churg stauss or ABPA but would welcome allergy input.   In meantime ok to refill tyl #3 for refractory coughing spells          Each maintenance medication was reviewed in detail including emphasizing most importantly the difference between maintenance and prns and under what circumstances the prns are to be triggered using an action plan format where appropriate.  Total time for H and P, chart review, counseling, teaching device and generating customized AVS unique to this office visit / charting = 36 min

## 2020-02-26 NOTE — Progress Notes (Signed)
Beth Beth [name], female    DOB: 12/16/1965,  MRN: [id number]   Brief patient profile:  17 yowf never smoker with h/o UACS  Resolved on just pepcid 20 mg at hs since 2011 which was last pulmonary ov   then covid 19 05/2019 while in [place] achy/cough/dizzy/ anosmia recovered  100% (except maybe smell) then April/May2021 cough / sob > saw PCP, nl cxr and no rx stayed about the same and tried zyrtec no change and started nexium 40 mg around 1st sept 2021 helped throat clearing 25% then EGD 12/24/19 Beth Beth [name] partner  done by GI nl > continue nexium and referred to pulmonary clinic 01/09/2020 by Beth Beth [name]      History of Present Illness  01/09/2020  Pulmonary/ 1st Beth [name] eval/Beth Beth [name]  S/p covid 05/2019  Chief Complaint  Patient presents with  . Pulmonary Consult    increased SOB x 4-5 months. She states she has to clear her throat often. She has a prod cough with very minimal yellow sputum. She states "feels like something stuck in my chest".  Dyspnea:with minimal activity  Cough: immediately at hs p zyrtec in am / slt yellow mucus  Sleep: bed blocks  SABA use:  Albuterol helps some last used 2 hours  prior to OV  Though baseline hfa technique quite poor  Cat x 17 years no new exp  Ex asthma as child rec Please remember to go to the lab department   for your tests - we will call you with the results when they are available.  Breztri Take 2 puffs first thing in am and then another 2 puffs about 12 hours later.  Work on inhaler technique:   Only use your albuterol   Take nexium Take 30-60 min before first meal of the day and take pepcid 40 mg at least on hour before bedtime  GERD  Diet  Depomedrol 120 mg IM  Please schedule a follow up Beth [name] visit in 2 weeks  Add:  Take delsym two tsp every 12 hours and supplement if needed with  Tylenol #3   up to  1 every 4 hours to suppress the urge to cough.  Once you have eliminated the cough for 3 straight days try reducing the Tylenol #3 first,   then the delsym as tolerated.        01/23/2020  f/u ov/Beth Beth [name] re: ? Bronchiectasis p covid  Chief Complaint  Patient presents with  . Follow-up    having hard time coughing up phlegm & its green when pt does  Dyspnea:  Better with activity but doe x hills x 30 min hike  Cough: much worse at hs with minimal thick green mucus production Sleeping: better prone / no better tylenol #3  SABA use: 4 x daily - never pre or rechallenges  02: none  Still throat clearing  [place]elt much better in [place] 2 recent trips  rec mucinex dm 1200 mg every 12 hours and use the flutter valve up as much as tylenol #3 up to 2 at bedtime - sleep prone Change breztri to symbicort 80 Take 2 puffs first thing in am and then another 2 puffs about 12 hours later.  Work on inhaler technique:  relax and gently blow all the way out then take a nice smooth deep breath back in, triggering the inhaler at same time you start breathing in.  Hold for up to 5 seconds if you can. Blow out thru nose. Rinse and gargle with water  when done Augmentin 875 mg take one pill twice daily  X 10 days    01/30/20  Medrol x 6 days  02/04/20 - Sinus CT 02/04/2020 >>> neg - HRCT chest 10/262021 >>> mild bronchiectasis with micronodules   02/11/2020  f/u ov/Beth Beth [name]/Beth Beth [name] re: bronchiectasis/ started nexium 3 week prior to OV  On symb 80 2bid then pruritic ash one week prior to OV   Chief Complaint  Patient presents with  . 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 Follow-up    Pt c/o wheezing and cough- prod with yellow sputum.   Dyspnea:  Not limited by breathing from desired activities  Not ex  Cough: gone p abx and pred, then  restarted 2 d prior - not using mucinex dm/ flutter as rec  Sleeping: better last few nights since restarting abx/ medrol on her own  SABA use: none  02: none   No obvious day to day or daytime variability or assoc   mucus plugs or hemoptysis or cp or chest tightness, subjective wheeze or overt sinus or hb symptoms.     Also denies any obvious fluctuation of symptoms with weather or environmental changes or other aggravating or alleviating factors except as outlined above   No unusual exposure hx or h/o childhood pna/ asthma or knowledge of premature birth.  Current Allergies, Complete Past Medical History, Past Surgical History, Family History, and Social History were reviewed in [employer] record.  ROS  The following are not active complaints unless bolded Hoarseness, sore throat, dysphagia, dental problems, itching, sneezing,  nasal congestion or discharge of excess mucus or purulent secretions, ear ache,   fever, chills, sweats, unintended wt loss or wt gain, classically pleuritic or exertional cp,  orthopnea pnd or arm/hand swelling  or leg swelling, presyncope, palpitations, abdominal pain, anorexia, nausea, vomiting, diarrhea  or change in bowel habits or change in bladder habits, change in stools or change in urine, dysuria, hematuria,  rash, arthralgias, visual complaints, headache, numbness, weakness or ataxia or problems with walking or coordination,  change in mood or  memory.        Current Meds  Medication Sig  . albuterol  (VENTOLIN HFA) 108 (90 Base) MCG/ACT inhaler Inhale 2 puffs into the lungs every 6 (six) hours as needed for wheezing or shortness of breath.  . ALPRAZolam (XANAX) 0.25 MG tablet Take 0.125 mg by mouth daily as needed for anxiety.   . Aspirin-Caffeine (ANACIN) 400-32 MG TABS Take 2 tablets by mouth daily as needed (headaches/pain).  . budesonide-formoterol (SYMBICORT) 80-4.5 MCG/ACT inhaler Take 2  puffs first thing in am and then another 2 puffs about 12 hours later.  . famotidine (PEPCID) 20 MG tablet One after bfast and one after supper  . Fluocinonide 0.1 % CREA Apply topically as directed.  . fluticasone (CUTIVATE) 0.05 % cream Apply 1 application topically 2 (two) times daily.  . methylPREDNISolone (MEDROL DOSEPAK) 4 MG TBPK tablet 4 X 1 DAY, 3 X 1 DAY, 2 X 1 DAY, 1 X 1 DAY AND STOP  . naproxen sodium (ALEVE) 220 MG tablet Take 220 mg by mouth daily as needed (headaches).  Marland Kitchen OVER THE COUNTER MEDICATION Take 1 tablet by mouth daily. Joint Flex otc supplement  . PARoxetine (PAXIL) 10 MG tablet Take 10 mg by mouth daily.                          Past Medical History:  Diagnosis Date  . Anemia   . GERD (gastroesophageal reflux disease)   . Headache   . Insomnia   . Iron deficiency        Objective:    02/26/2020     111 02/11/2020       113   01/23/20 114 lb 9.6 oz (52 kg)  01/09/20 114 lb 3.2 oz (51.8 kg)  12/05/19 116 lb 1.6 oz (52.7 kg)      Vital signs reviewed  02/26/2020  - Note at rest 02 sats  98% on RA    HEENT : pt wearing mask not removed for exam due to covid -19 concerns.    NECK :  without JVD/Nodes/TM/ nl carotid upstrokes bilaterally   LUNGS: no acc muscle use,  Nl contour chest which is clear to A and P bilaterally with rattling cough on FVC maneuver  CV:  RRR  no s3 or murmur or increase in P2, and no edema   ABD:  soft and nontender with nl inspiratory excursion in the supine position. No bruits or organomegaly appreciated, bowel sounds  nl  MS:  Nl gait/ ext warm without deformities, calf tenderness, cyanosis or clubbing No obvious joint restrictions   SKIN: warm and dry with slt improved dry scaly rash both elbows and macular splotchy hyperpigmented rash both post thigh/ calves   NEURO:  alert, approp, nl sensorium with  no motor or cerebellar deficits apparent.                       Assessment

## 2020-02-26 NOTE — Patient Instructions (Addendum)
Alpha one anti trypsin deficiency phenotype  Needs to be determined for future reference in your direct relatives    For cough congestion >   mucinex dm 1200 mg every 12 hours and use flutter valve as much as possible  We call to get you seen by Dr Dellis Anes for allergy eval asap (in [place]) and I will check on a referral to a [facility] doctor about your bronchiectasis   For nasty mucus >>>  Augmentin 875 mg take one pill twice daily  X 10 days - take at breakfast and supper with large glass of water.  It would help reduce the usual side effects (diarrhea and yeast infections) if you ate cultured yogurt at lunch.   Stop all heavy scent exposure until further notice    We can see you here as needed pending above referrals

## 2020-02-26 NOTE — Assessment & Plan Note (Signed)
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

## 2020-03-02 ENCOUNTER — Ambulatory Visit (INDEPENDENT_AMBULATORY_CARE_PROVIDER_SITE_OTHER): Payer: PRIVATE HEALTH INSURANCE | Admitting: Gastroenterology

## 2020-03-09 ENCOUNTER — Ambulatory Visit: Payer: No Typology Code available for payment source | Admitting: Internal Medicine

## 2020-03-09 ENCOUNTER — Ambulatory Visit (INDEPENDENT_AMBULATORY_CARE_PROVIDER_SITE_OTHER): Payer: PRIVATE HEALTH INSURANCE | Admitting: Gastroenterology

## 2020-04-22 ENCOUNTER — Ambulatory Visit: Payer: Self-pay | Admitting: Allergy & Immunology

## 2020-05-18 ENCOUNTER — Ambulatory Visit (INDEPENDENT_AMBULATORY_CARE_PROVIDER_SITE_OTHER): Payer: PRIVATE HEALTH INSURANCE | Admitting: Gastroenterology

## 2021-03-02 ENCOUNTER — Telehealth: Payer: Self-pay | Admitting: Internal Medicine

## 2021-03-02 MED ORDER — BUDESONIDE-FORMOTEROL FUMARATE 80-4.5 MCG/ACT IN AERO
INHALATION_SPRAY | RESPIRATORY_TRACT | 0 refills | Status: DC
Start: 2021-03-02 — End: 2021-03-03

## 2021-03-02 NOTE — Telephone Encounter (Signed)
I have called the pt and she is aware of appt with MW in [facility] office tomorrow at 1130.   She stated that she needed the refill of the symbicort.  This has been sent to the pharmacy as well.

## 2021-03-03 ENCOUNTER — Ambulatory Visit (INDEPENDENT_AMBULATORY_CARE_PROVIDER_SITE_OTHER): Payer: PRIVATE HEALTH INSURANCE | Admitting: Internal Medicine

## 2021-03-03 ENCOUNTER — Other Ambulatory Visit: Payer: Self-pay

## 2021-03-03 ENCOUNTER — Encounter: Payer: Self-pay | Admitting: Internal Medicine

## 2021-03-03 DIAGNOSIS — J45991 Cough variant asthma: Secondary | ICD-10-CM | POA: Diagnosis not present

## 2021-03-03 MED ORDER — BUDESONIDE-FORMOTEROL FUMARATE 80-4.5 MCG/ACT IN AERO
INHALATION_SPRAY | RESPIRATORY_TRACT | 11 refills | Status: DC
Start: 2021-03-03 — End: 2022-06-28

## 2021-03-03 NOTE — Progress Notes (Signed)
Beth [name], female    DOB: 1965/12/23,  MRN: [id number]   Brief patient profile:  65  yowf never smoker with h/o UACS  Resolved on just pepcid 20 mg at hs since 2011 which was last pulmonary ov   then covid 19 05/2019 while in [place] achy/cough/dizzy/ anosmia recovered  100% (except maybe smell) then April/May2021 cough / sob > saw PCP, nl cxr and no rx stayed about the same and tried zyrtec no change and started nexium 40 mg around 1st sept 2021 helped throat clearing 25% then EGD 12/24/19 Beth Ramon's partner  done by GI nl > continue nexium and referred to pulmonary clinic 01/09/2020 by Beth [name]      History of Present Illness  01/09/2020  Pulmonary/ 1st office eval/Beth [name]  S/p covid 05/2019  Chief Complaint  Patient presents with   Pulmonary Consult    increased SOB x 4-5 months. She states she has to clear her throat often. She has a prod cough with very minimal yellow sputum. She states "feels like something stuck in my chest".  Dyspnea:with minimal activity  Cough: immediately at hs p zyrtec in am / slt yellow mucus  Sleep: bed blocks  SABA use:  Albuterol helps some last used 2 hours  prior to OV  Though baseline hfa technique quite poor  Cat x 17 years no new exp  Ex asthma as child rec Please remember to go to the lab department   for your tests - we will call you with the results when they are available.  Breztri Take 2 puffs first thing in am and then another 2 puffs about 12 hours later.  Work on inhaler technique:   Only use your albuterol   Take nexium Take 30-60 min before first meal of the day and take pepcid 40 mg at least on hour before bedtime  GERD  Diet  Depomedrol 120 mg IM  Please schedule a follow up office visit in 2 weeks  Add:  Take delsym two tsp every 12 hours and supplement if needed with  Tylenol #3   up to  1 every 4 hours to suppress the urge to cough.  Once you have eliminated the cough for 3 straight days try reducing the Tylenol #3 first,   then the delsym as tolerated.        01/23/2020  f/u ov/Beth [name] re: ? Bronchiectasis p covid  Chief Complaint  Patient presents with   Follow-up    having hard time coughing up phlegm & its green when pt does  Dyspnea:  Better with activity but doe x hills x 30 min hike  Cough: much worse at hs with minimal thick green mucus production Sleeping: better prone / no better tylenol #3  SABA use: 4 x daily - never pre or rechallenges  02: none  Still throat clearing  [place]elt much better [place] 2 recent trips  rec mucinex dm 1200 mg every 12 hours and use the flutter valve up as much as tylenol #3 up to 2 at bedtime - sleep prone Change breztri to symbicort 80 Take 2 puffs first thing in am and then another 2 puffs about 12 hours later.  Work on inhaler technique:  relax and gently blow all the way out then take a nice smooth deep breath back in, triggering the inhaler at same time you start breathing in.  Hold for up to 5 seconds if you can. Blow out thru nose. Rinse and gargle with  water when done Augmentin 875 mg take one pill twice daily  X 10 days    01/30/20  Medrol x 6 days  02/04/20 - Sinus CT 02/04/2020 >>> neg - HRCT chest 10/262021 >>> mild bronchiectasis with micronodules   02/11/2020  f/u ov/Beth [name] re: bronchiectasis/ started nexium 3 week prior to OV  On symb 80 2bid then pruritic ash one week prior to OV   Chief Complaint  Patient presents with   Follow-up    rash on back of legs, elbows, back of arms, feels like hairball throat  onset of globus x 4 days and "tastes mucus " but nothing comes up Dyspnea: not aerobic but not limited by doe   Cough: mostly throat clearing  Sleeping: not sleeping well but no resp c/o's SABA use: none  02: none  rec For drainage / throat tickle try take CHLORPHENIRAMINE  4 mg     Medrol repeat  depomedrol 120 mg IM  Stop nexium and take pepcid 20 mg one after meals and bedtime  See Beth [name] re rash Please remember to  go to the lab department : -  Quant Igs  02/11/20  Nl -  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  It would help reduce the usual side effects (diarrhea and yeast infections) if you ate cultured yogurt at lunch.  Stop all heavy scent exposure until further notice  We can see you here as needed pending above referrals    03/03/2021  f/u ov/[place] office/Beth [name] re: obst bronchiectasis  maint on symbicort 80 2 bid  really just taking prn Chief Complaint  Patient presents with   Follow-up    Feels she has improved a lot since last ov but is using inhalers more often.   Dyspnea:  just when exercises in cold weather and needs albuterol to finish  Cough: none  Sleeping: no resp cc  SABA use: rarely  02: none  Covid status: vax  x 2  / infected x 2    No obvious day to day or daytime variability or assoc excess/ purulent sputum or mucus plugs or hemoptysis or cp or chest tightness, subjective wheeze or overt  sinus or hb symptoms.   Sleeping  without nocturnal  or early am exacerbation  of respiratory  c/o's or need for noct saba. Also  denies any obvious fluctuation of symptoms with weather or environmental changes or other aggravating or alleviating factors except as outlined above   No unusual exposure hx or h/o childhood pna/ asthma or knowledge of premature birth.  Current Allergies, Complete Past Medical History, Past Surgical History, Family History, and Social History were reviewed in [employer] record.  ROS  The following are not active complaints unless bolded Hoarseness, sore throat, dysphagia, dental problems, itching, sneezing,  nasal congestion or discharge of excess mucus or purulent secretions, ear ache,   fever, chills, sweats, unintended wt loss or wt gain, classically pleuritic or exertional cp,  orthopnea pnd or arm/hand swelling  or leg swelling, presyncope, palpitations, abdominal pain, anorexia, nausea, vomiting, diarrhea  or change in bowel habits or change in bladder habits, change in stools or change in urine, dysuria, hematuria,  rash, arthralgias, visual complaints, headache, numbness, weakness or ataxia or problems with walking or coordination,  change in mood or  memory.        Current Meds  Medication Sig   albuterol (VENTOLIN HFA) 108 (90 Base) MCG/ACT inhaler Inhale 2 puffs into the lungs every 6 (six) hours as needed for wheezing or shortness of breath.   ALPRAZolam (XANAX) 0.25 MG tablet Take 0.125 mg by mouth daily as needed for anxiety.    Aspirin-Caffeine (ANACIN) 400-32 MG TABS Take 2 tablets by mouth daily as needed (headaches/pain).   budesonide-formoterol (SYMBICORT) 80-4.5 MCG/ACT inhaler Take 2 puffs first thing in am and then another 2 puffs about 12 hours later.   Cetirizine HCl (ZYRTEC ALLERGY PO) Take by mouth.   diphenhydrAMINE-PSE-APAP (BENADRYL ALLERGY/COLD PO) Take by mouth.   famotidine (PEPCID) 20 MG tablet One after bfast  and one after supper   Fexofenadine HCl (MUCINEX ALLERGY PO) Take by mouth.   GNP BUDESONIDE NASAL SPRAY NA Place into the nose.   naproxen sodium (ALEVE) 220 MG tablet Take 220 mg by mouth daily as needed (headaches).   OVER THE COUNTER MEDICATION Take 1 tablet by mouth daily. Joint Flex otc supplement   PARoxetine (PAXIL) 10 MG tablet Take 10 mg by mouth daily.                           Past Medical History:  Diagnosis Date   Anemia    GERD (gastroesophageal reflux disease)    Headache    Insomnia    Iron deficiency        Objective:    03/03/2021     113  02/26/2020     111 02/11/2020       113   01/23/20 114 lb 9.6 oz (52 kg)  01/09/20 114 lb 3.2 oz (51.8 kg)  12/05/19 116 lb 1.6 oz (52.7 kg)    Vital signs reviewed  03/03/2021  - Note at rest 02 sats  98% on RA   General appearance:    amb wf nad    HEENT : oropharynx min erythema, no thrush or blisters   NECK :  without JVD/Nodes/TM/ nl carotid upstrokes bilaterally   LUNGS: no acc muscle use,  Nl contour chest which is clear to A and P bilaterally without cough on insp or exp maneuvers   CV:  RRR  no s3 or murmur or increase in P2, and no edema   ABD:  soft and nontender with nl inspiratory excursion in the supine position. No bruits or organomegaly appreciated, bowel sounds nl  MS:  Nl gait/ ext warm without deformities, calf tenderness, cyanosis or clubbing No obvious joint restrictions   SKIN: warm and dry without lesions    NEURO:  alert, approp, nl sensorium with  no motor or cerebellar deficits apparent.                       Assessment

## 2021-03-03 NOTE — Patient Instructions (Signed)
No change medications   Please schedule a follow up visit in 12  months but call sooner if needed    

## 2021-03-04 ENCOUNTER — Encounter: Payer: Self-pay | Admitting: Internal Medicine

## 2021-03-04 NOTE — Assessment & Plan Note (Addendum)
Flared x 08/2019 p covid 05/2019  - 01/09/2020   start breztri 2bid x 2 weeks then return - Allergy profile 01/09/2020 >  Eos 0.9 /  IgE  46 then repeat level  203 on 02/11/20 with active rash - 01/23/2020  After extensive coaching inhaler device,  effectiveness =    75% > change to symb 80 2bid as breathing better but not the cough ? Bronchiectasis  > see sep a/p - 02/26/2020  After extensive coaching inhaler device,  effectiveness =    90%> continue symb 80 2bid   -Allergy referral 02/26/2020 > not done  - 03/03/2021  After extensive coaching inhaler device,  effectiveness =    90%   Except for symptoms when ex in cold weather > All goals of chronic asthma control met including optimal function and elimination of symptoms with minimal need for rescue therapy.  Contingencies discussed in full including contacting this office immediately if not controlling the symptoms using the rule of two's.     Ok to continue symbicort 80 2bid prn Based on two studies from NEJM  378; 20 p 1865 (2018) and 380 : p2020-30 (2019) in pts with mild asthma it is reasonable to use low dose symbicort eg 80 2bid "prn" flare in this setting but I emphasized this was only shown with symbicort and takes advantage of the rapid onset of action but is not the same as "rescue therapy" but can be stopped once the acute symptoms have resolved and the need for rescue has been minimized (< 2 x weekly)    F/u can be yearly  - call sooner if needed          Each maintenance medication was reviewed in detail including emphasizing most importantly the difference between maintenance and prns and under what circumstances the prns are to be triggered using an action plan format where appropriate.  Total time for H and P, chart review, counseling, reviewing hfa device(s) and generating customized AVS unique to this office visit / same day charting = 25 min

## 2021-08-25 IMAGING — CT CT CHEST HIGH RESOLUTION W/O CM
2 of 7 series · 14 of 36 positions shown, 17 images · non-contrast
Comparison: No priors.

CLINICAL DATA: 54-year-old female with history of cough and
shortness of breath. Bronchiectasis.

EXAM:
CT CHEST WITHOUT CONTRAST
TECHNIQUE: Multidetector CT imaging of the chest was performed following the
standard protocol without intravenous contrast. High resolution
imaging of the lungs, as well as inspiratory and expiratory imaging,
was performed.

[Series 4: high resolution · axial · 0.59mm/px · z∈[-742,-497]mm · 11 of 295 slices shown, 14 images]
[im 25/295  mediastinal]
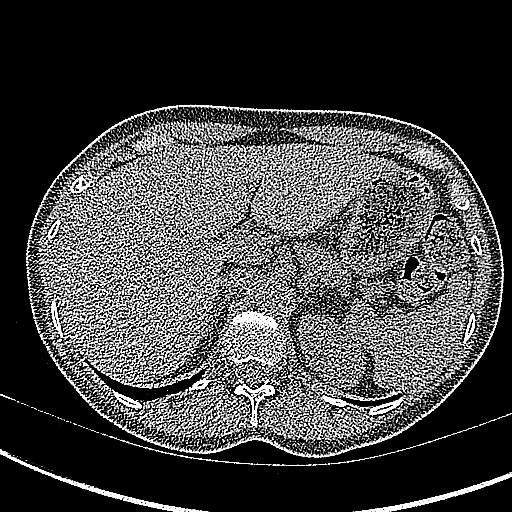
[im 25/295  lung]
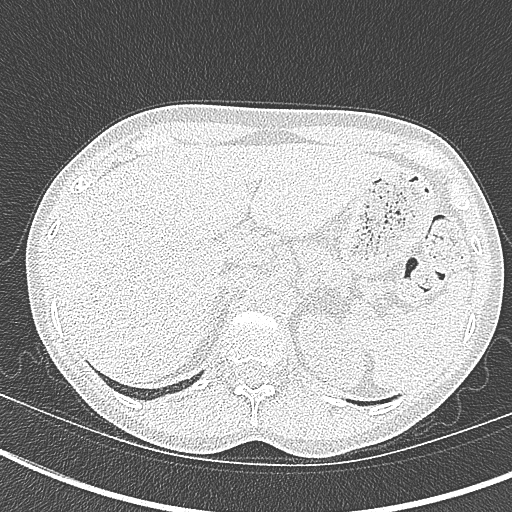
[im 50/295  lung]
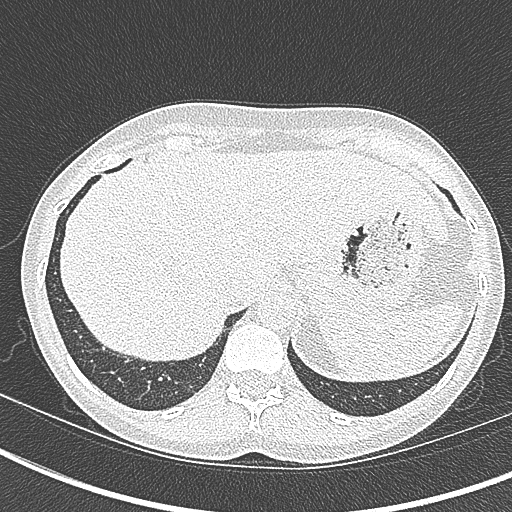
[im 74/295  lung]
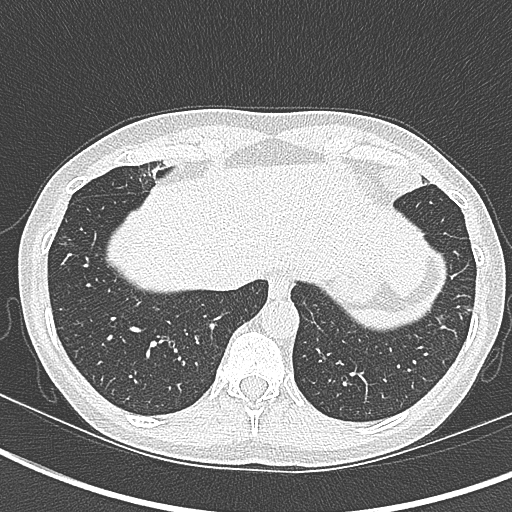
[im 99/295  lung]
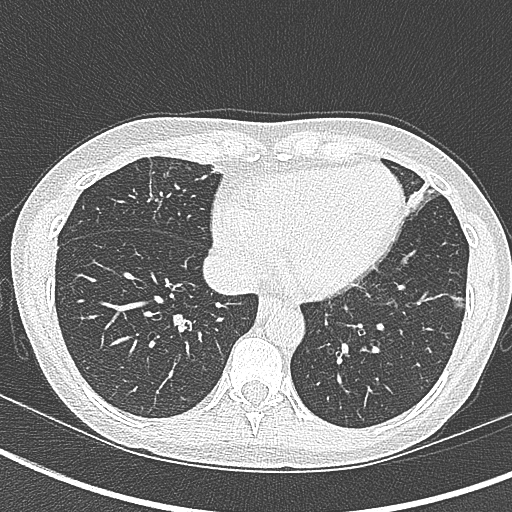
[im 123/295  mediastinal]
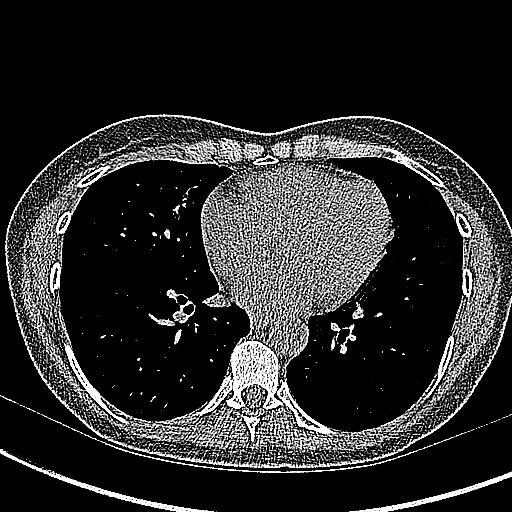
[im 123/295  lung]
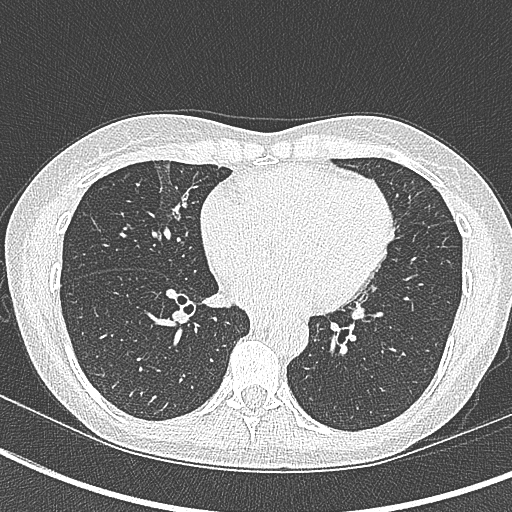
[im 148/295  lung]
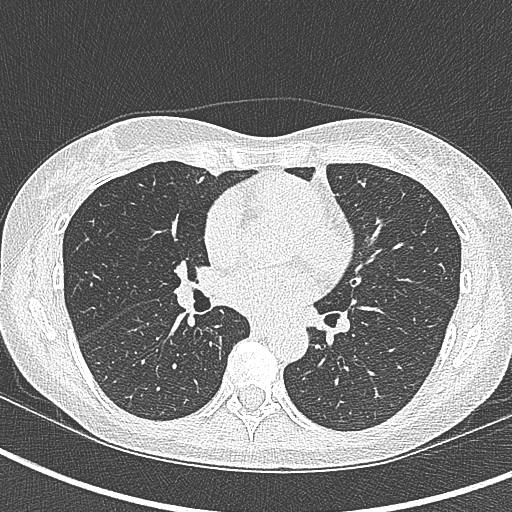
[im 172/295  lung]
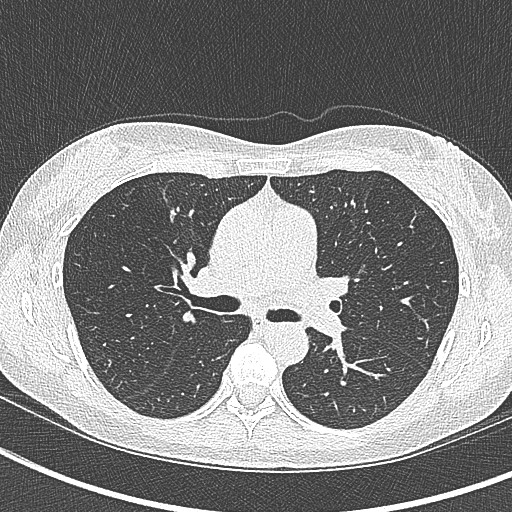
[im 197/295  lung]
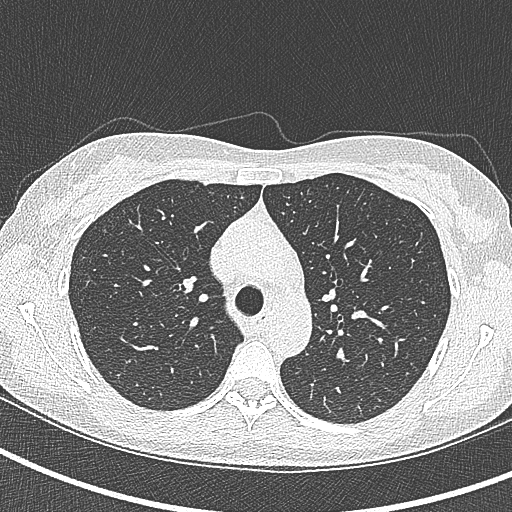
[im 221/295  mediastinal]
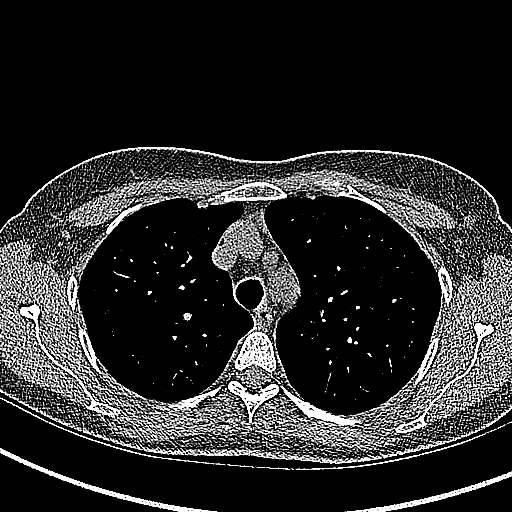
[im 221/295  lung]
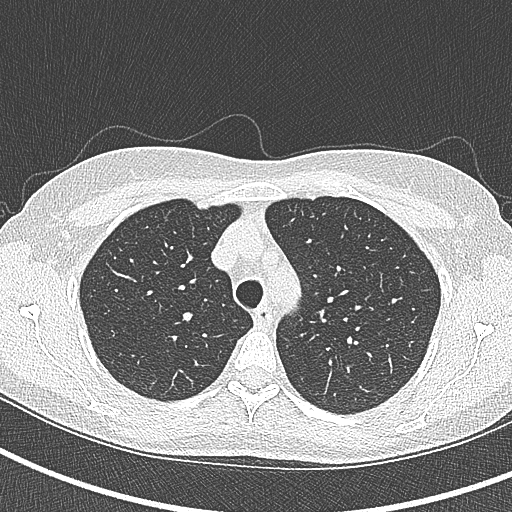
[im 246/295  lung]
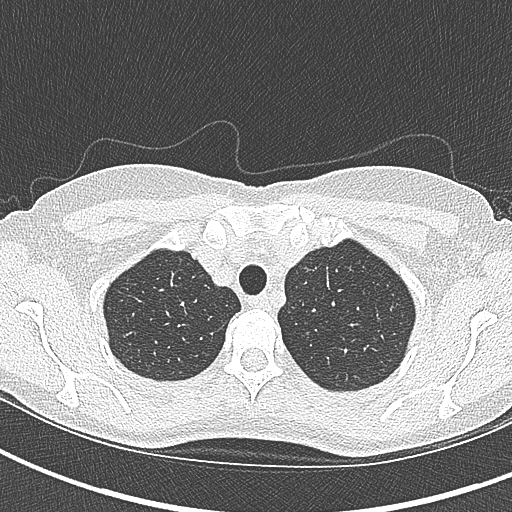
[im 270/295  lung]
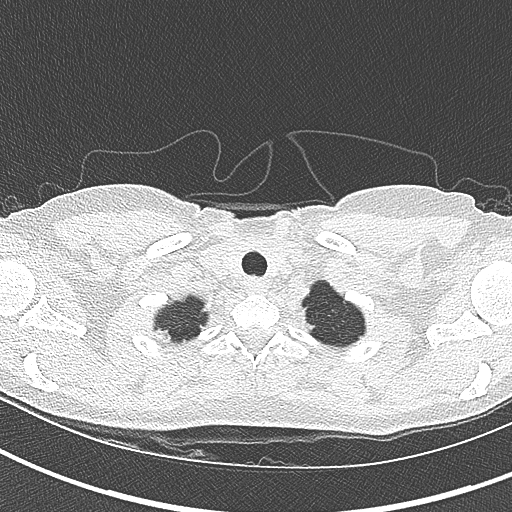

[Series 8: coronal · coronal · 0.59mm/px · 3 of 106 slices shown]
[im 22/106  lung]
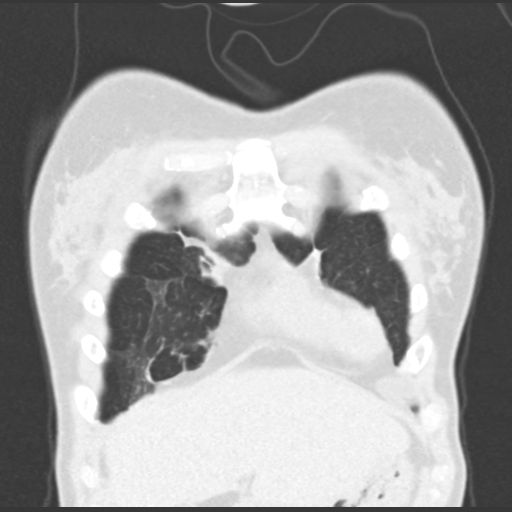
[im 43/106  lung]
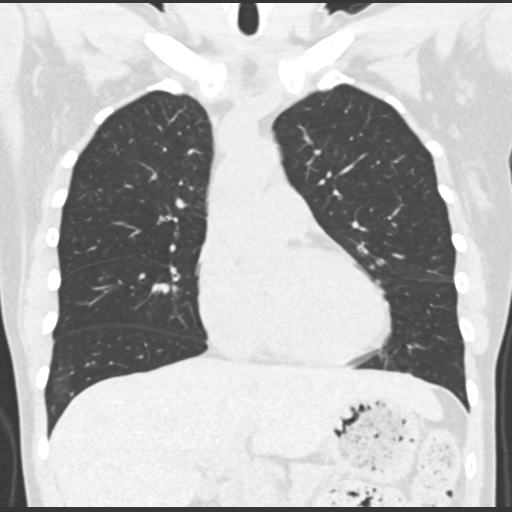
[im 64/106  lung]
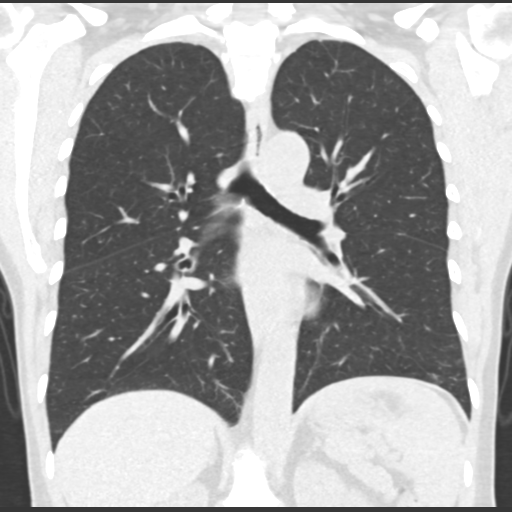

[14 of 36 positions shown; findings below may reference images not displayed]

FINDINGS: Cardiovascular: Heart size is normal. There is no significant
pericardial fluid, thickening or pericardial calcification. No
atherosclerotic calcifications in the thoracic aorta or the coronary
arteries.

Mediastinum/Nodes: No pathologically enlarged mediastinal or hilar
lymph nodes. Please note that accurate exclusion of hilar adenopathy
is limited on noncontrast CT scans. Esophagus is unremarkable in
appearance. No axillary lymphadenopathy.

Lungs/Pleura: A few scattered areas of very mild peripheral
predominant cylindrical bronchiectasis are noted in the lungs
bilaterally, with some associated thickening of the
peribronchovascular interstitium and some peribronchovascular
ground-glass attenuation and regional architectural distortion.
Findings are most apparent in the right middle lobe and inferior
segment of the lingula. No more generalized areas of septal
thickening, subpleural reticulation or honeycombing. Inspiratory and
expiratory imaging is remarkable for some moderate air trapping
indicative of small airways disease. No acute consolidative airspace
disease. No pleural effusions. A few scattered tiny 1-2 mm pulmonary
nodules are noted in the lungs bilaterally. No larger more
suspicious appearing pulmonary nodules or masses are noted.

Upper Abdomen: 3 mm nonobstructive calculus in the upper pole
collecting system of the left kidney.

Musculoskeletal: There are no aggressive appearing lytic or blastic
lesions noted in the visualized portions of the skeleton.
IMPRESSION: 1. Scattered areas of mild cylindrical bronchiectasis, thickening of
the peribronchovascular interstitium and peribronchovascular
ground-glass attenuation with regional architectural distortion.
Findings are favored to reflect a chronic atypical infectious
process, rather than interstitial lung disease. Repeat
high-resolution chest CT should be considered in 12 months to assess
for temporal changes in the appearance of the lung parenchyma.
2. Moderate air trapping indicative of small airways disease.
3. Tiny 1-2 mm pulmonary nodules, nonspecific, but statistically
likely benign. No follow-up needed if patient is low-risk (and has
no known or suspected primary neoplasm). Non-contrast chest CT can
be considered in 12 months if patient is high-risk. This
recommendation follows the consensus statement: Guidelines for
Management of Incidental Pulmonary Nodules Detected on CT Images:
4. 3 mm nonobstructive calculus in the upper pole collecting system
of the left kidney.

## 2022-02-10 ENCOUNTER — Ambulatory Visit (INDEPENDENT_AMBULATORY_CARE_PROVIDER_SITE_OTHER): Payer: PRIVATE HEALTH INSURANCE

## 2022-02-10 ENCOUNTER — Encounter: Payer: Self-pay | Admitting: Internal Medicine

## 2022-02-10 ENCOUNTER — Ambulatory Visit (INDEPENDENT_AMBULATORY_CARE_PROVIDER_SITE_OTHER): Payer: PRIVATE HEALTH INSURANCE | Admitting: Internal Medicine

## 2022-02-10 VITALS — BP 124/86 | HR 64 | Ht 62.0 in | Wt 121.0 lb

## 2022-02-10 DIAGNOSIS — J45991 Cough variant asthma: Secondary | ICD-10-CM

## 2022-02-10 DIAGNOSIS — J479 Bronchiectasis, uncomplicated: Secondary | ICD-10-CM

## 2022-02-10 NOTE — Patient Instructions (Signed)
Please remember to go to the  x-ray department  for your tests - we will call you with the results when they are available    Symbicort 80 Take 2 puffs first thing in am and then another 2 puffs about 12 hours later ok to taper down but immediately up to full dose for any resp flares   Please schedule a follow up visit in 12  months but call sooner if needed

## 2022-02-10 NOTE — Assessment & Plan Note (Signed)
Flared x 08/2019 p covid 05/2019  - 01/09/2020   start breztri 2bid x 2 weeks then return - Allergy profile 01/09/2020 >  Eos 0.9 /  IgE  46 then repeat level  203 on 02/11/20 with active rash - 01/23/2020  After extensive coaching inhaler device,  effectiveness =    75% > change to symb 80 2bid as breathing better but not the cough ? Bronchiectasis  > see sep a/p - 02/26/2020  After extensive coaching inhaler device,  effectiveness =    90%> continue symb 80 2bid   -Allergy referral 02/26/2020 > not done  - 03/03/2021  After extensive coaching inhaler device,  effectiveness =    90%   All goals of chronic asthma control met including optimal function and elimination of symptoms with minimal need for rescue therapy.  Contingencies discussed in full including contacting this office immediately if not controlling the symptoms using the rule of two's.     Rec continue symbicort 80 up to 2bid ."Prn" Based on two studies from [place]; 20 p 1865 (2018) and 380 : p2020-30 (2019) in pts with mild asthma it is reasonable to use low dose symbicort eg 80 2bid "prn" flare in this setting but I emphasized this was only shown with symbicort and takes advantage of the rapid onset of action but is not the same as "rescue therapy" but can be stopped once the acute symptoms have resolved and the need for rescue has been minimized (< 2 x weekly)    F/u yearly - sooner prn           Each maintenance medication was reviewed in detail including emphasizing most importantly the difference between maintenance and prns and under what circumstances the prns are to be triggered using an action plan format where appropriate.  Total time for H and P, chart review, counseling, reviewing hfa device(s) and generating customized AVS unique to this office visit / same day charting = 20 min

## 2022-02-10 NOTE — Assessment & Plan Note (Signed)
Onset of refractory cough April 2021 p covid 05/2019  - flutter valve/ prone position sleeping rec 01/23/2020  - Sinus CT 02/04/2020 >>> neg - HRCT chest 10/262021 >>> mild bronchiectasis with micronodules - 02/06/2020 pt reported 90% improvement on symbicort 80 2bid plus flutter p augmentin x 10 days  -  Quant Igs  02/11/20  Nl -  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   FS  Level 109   Adequate control on present rx, reviewed in detail with pt > no change in rx needed

## 2022-02-10 NOTE — Progress Notes (Signed)
Beth [name], female    DOB: 1965-11-06,  MRN: [id number]   Brief patient profile:  45  yowf never smoker with h/o UACS  Resolved on just pepcid 20 mg at hs since 2011 which was last pulmonary ov   then covid 19 05/2019 while in [place] achy/cough/dizzy/ anosmia recovered  100% (except maybe smell) then April/May2021 cough / sob > saw PCP, nl cxr and no rx stayed about the same and tried zyrtec no change and started nexium 40 mg around 1st sept 2021 helped throat clearing 25% then EGD 12/24/19 Dr Ramon's partner  done by GI nl > continue nexium and referred to pulmonary clinic 01/09/2020 by Richarda Blade      History of Present Illness  01/09/2020  Pulmonary/ 1st office eval/Adron Geisel  S/p covid 05/2019  Chief Complaint  Patient presents with   Pulmonary Consult    increased SOB x 4-5 months. She states she has to clear her throat often. She has a prod cough with very minimal yellow sputum. She states "feels like something stuck in my chest".  Dyspnea:with minimal activity  Cough: immediately at hs p zyrtec in am / slt yellow mucus  Sleep: bed blocks  SABA use:  Albuterol helps some last used 2 hours  prior to OV  Though baseline hfa technique quite poor  Cat x 17 years no new exp  Ex asthma as child rec Please remember to go to the lab department   for your tests - we will call you with the results when they are available.  Breztri Take 2 puffs first thing in am and then another 2 puffs about 12 hours later.  Work on inhaler technique:   Only use your albuterol   Take nexium Take 30-60 min before first meal of the day and take pepcid 40 mg at least on hour before bedtime  GERD  Diet  Depomedrol 120 mg IM  Please schedule a follow up office visit in 2 weeks  Add:  Take delsym two tsp every 12 hours and supplement if needed with  Tylenol #3   up to  1 every 4 hours to suppress the urge to cough.  Once you have eliminated the cough for 3 straight days try reducing the Tylenol #3 first,   then the delsym as tolerated.        01/23/2020  f/u ov/Henri Guedes re: ? Bronchiectasis p covid  Chief Complaint  Patient presents with   Follow-up    having hard time coughing up phlegm & its green when pt does  Dyspnea:  Better with activity but doe x hills x 30 min hike  Cough: much worse at hs with minimal thick green mucus production Sleeping: better prone / no better tylenol #3  SABA use: 4 x daily - never pre or rechallenges  02: none  Still throat clearing  Felt much better in mountains on 2 recent trips  rec mucinex dm 1200 mg every 12 hours and use the flutter valve up as much as tylenol #3 up to 2 at bedtime - sleep prone Change breztri to symbicort 80 Take 2 puffs first thing in am and then another 2 puffs about 12 hours later.  Work on inhaler technique:  relax and gently blow all the way out then take a nice smooth deep breath back in, triggering the inhaler at same time you start breathing in.  Hold for up to 5 seconds if you can. Blow out thru nose. Rinse and gargle with  water when done Augmentin 875 mg take one pill twice daily  X 10 days    01/30/20  Medrol x 6 days  02/04/20 - Sinus CT 02/04/2020 >>> neg - HRCT chest 10/262021 >>> mild bronchiectasis with micronodules   02/11/2020  f/u ov/[place] office/Lajoyce Tamura re: bronchiectasis/ started nexium 3 week prior to OV  On symb 80 2bid then pruritic ash one week prior to [place]  Patient presents with   Follow-up    rash on back of legs, elbows, back of arms, feels like hairball throat  onset of globus x 4 days and "tastes mucus " but nothing comes up Dyspnea: not aerobic but not limited by doe   Cough: mostly throat clearing  Sleeping: not sleeping well but no resp c/o's SABA use: none  02: none  rec For drainage / throat tickle try take CHLORPHENIRAMINE  4 mg     Medrol repeat  depomedrol 120 mg IM  Stop nexium and take pepcid 20 mg one after meals and bedtime  See Dr Nevada Crane re rash Please remember to  go to the lab department : -  Quant Igs  02/11/20  Nl -  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   FS  Level 109         03/03/2021  f/u ov/[place] office/Eshani Maestre re: obst bronchiectasis  maint on symbicort 80 2 bid  really just taking prn Chief Complaint  Patient presents with   Follow-up    Feels she has improved a lot since last ov but is using inhalers more often.   Dyspnea:  just when exercises in cold weather and needs albuterol to finish  Cough: none  Sleeping: no resp cc  SABA use: rarely  02: none  Covid status: vax  x 2  / infected x 2  Rec No change rx    02/10/2022  f/u ov/Yeudiel Mateo re: obst bronchiectasis since covid   maint on stymbicort 80 one puff each pm  and prn daytime  Chief Complaint  Patient presents with   Follow-up   Dyspnea:  symbicort 80 x 1 before ex  prevents any symptoms with ex even in cold weather  Cough: none  Sleeping: fine now  SABA use: not now  02: none      No obvious day to day or daytime variability or assoc excess/ purulent sputum or mucus plugs or hemoptysis or cp or chest tightness, subjective wheeze or overt sinus or hb symptoms.   Sleeping  without nocturnal  or early am exacerbation  of respiratory  c/o's or need for noct saba. Also denies any obvious fluctuation of symptoms with weather or environmental changes or other aggravating or alleviating factors except as outlined above   No unusual exposure hx or h/o childhood pna/ asthma or knowledge of premature birth.  Current Allergies, Complete Past Medical History, Past Surgical History, Family History, and Social History were reviewed in [employer] record.  ROS  The following are not active complaints unless bolded Hoarseness, sore throat, dysphagia, dental problems, itching, sneezing,  nasal congestion or discharge of excess mucus or purulent secretions, ear ache,   fever, chills, sweats, unintended wt loss or wt gain, classically pleuritic or exertional cp,  orthopnea  pnd or arm/hand swelling  or leg swelling, presyncope, palpitations, abdominal pain, anorexia, nausea, vomiting, diarrhea  or change in bowel habits or change in bladder habits, change in stools or change in urine, dysuria, hematuria,  rash, arthralgias, visual complaints, headache, numbness,  weakness or ataxia or problems with walking or coordination,  change in mood or  memory.        Current Meds  Medication Sig   albuterol (VENTOLIN HFA) 108 (90 Base) MCG/ACT inhaler Inhale 2 puffs into the lungs every 6 (six) hours as needed for wheezing or shortness of breath.   ALPRAZolam (XANAX) 0.25 MG tablet Take 0.125 mg by mouth daily as needed for anxiety.    Aspirin-Caffeine (ANACIN) 400-32 MG TABS Take 2 tablets by mouth daily as needed (headaches/pain).   budesonide-formoterol (SYMBICORT) 80-4.5 MCG/ACT inhaler Take 2 puffs first thing in am and then another 2 puffs about 12 hours later.   Cetirizine HCl (ZYRTEC ALLERGY PO) Take by mouth.   diphenhydrAMINE-PSE-APAP (BENADRYL ALLERGY/COLD PO) Take by mouth.   famotidine (PEPCID) 20 MG tablet One after bfast and one after supper   Fexofenadine HCl (MUCINEX ALLERGY PO) Take by mouth.   GNP BUDESONIDE NASAL SPRAY NA Place into the nose.   naproxen sodium (ALEVE) 220 MG tablet Take 220 mg by mouth daily as needed (headaches).   PARoxetine (PAXIL) 10 MG tablet Take 10 mg by mouth daily.                           Past Medical History:  Diagnosis Date   Anemia    GERD (gastroesophageal reflux disease)    Headache    Insomnia    Iron deficiency        Objective:    Wts  02/10/2022       121  03/03/2021     113  02/26/2020     111 02/11/2020       113   01/23/20 114 lb 9.6 oz (52 kg)  01/09/20 114 lb 3.2 oz (51.8 kg)  12/05/19 116 lb 1.6 oz (52.7 kg)    Vital signs reviewed  02/10/2022  - Note at rest 02 sats  97% on RA   General appearance:    amb pleasant wf nad   HEENT : Oropharynx  clear      Nasal turbinates nl    NECK  :  without  apparent JVD/ palpable Nodes/TM    LUNGS: no acc muscle use,  Nl contour chest which is clear to A and P bilaterally without cough on insp or exp maneuvers   CV:  RRR  no s3 or murmur or increase in P2, and no edema   ABD:  soft and nontender with nl inspiratory excursion in the supine position. No bruits or organomegaly appreciated   MS:  Nl gait/ ext warm without deformities Or obvious joint restrictions  calf tenderness, cyanosis or clubbing    SKIN: warm and dry without lesions    NEURO:  alert, approp, nl sensorium with  no motor or cerebellar deficits apparent.                    Assessment

## 2022-02-14 NOTE — Progress Notes (Signed)
Spoke with pt and notified of results per Dr. Wert. Pt verbalized understanding and denied any questions. 

## 2022-02-17 DIAGNOSIS — B009 Herpesviral infection, unspecified: Secondary | ICD-10-CM | POA: Insufficient documentation

## 2022-03-01 ENCOUNTER — Ambulatory Visit: Payer: PRIVATE HEALTH INSURANCE | Admitting: Internal Medicine

## 2022-03-01 ENCOUNTER — Telehealth: Payer: Self-pay | Admitting: Internal Medicine

## 2022-03-01 MED ORDER — BUDESONIDE-FORMOTEROL FUMARATE 80-4.5 MCG/ACT IN AERO: 2.0000 | INHALATION_SPRAY | Freq: Two times a day (BID) | RESPIRATORY_TRACT | 12 refills | Status: DC

## 2022-03-01 NOTE — Telephone Encounter (Signed)
Refill sent to [facility] in [place]. Nothing further needed

## 2022-03-02 ENCOUNTER — Telehealth: Payer: Self-pay | Admitting: Internal Medicine

## 2022-03-02 MED ORDER — BUDESONIDE-FORMOTEROL FUMARATE 80-4.5 MCG/ACT IN AERO: 2.0000 | INHALATION_SPRAY | Freq: Two times a day (BID) | RESPIRATORY_TRACT | 12 refills | Status: DC

## 2022-03-02 NOTE — Telephone Encounter (Signed)
Refill sent again.  Patient notified  Nothing further needed

## 2022-06-28 ENCOUNTER — Ambulatory Visit (INDEPENDENT_AMBULATORY_CARE_PROVIDER_SITE_OTHER): Payer: PRIVATE HEALTH INSURANCE

## 2022-06-28 ENCOUNTER — Ambulatory Visit (INDEPENDENT_AMBULATORY_CARE_PROVIDER_SITE_OTHER): Payer: PRIVATE HEALTH INSURANCE | Admitting: Internal Medicine

## 2022-06-28 ENCOUNTER — Encounter: Payer: Self-pay | Admitting: Internal Medicine

## 2022-06-28 VITALS — BP 154/90 | HR 64 | Temp 98.2°F | Ht 62.0 in | Wt 113.8 lb

## 2022-06-28 DIAGNOSIS — J479 Bronchiectasis, uncomplicated: Secondary | ICD-10-CM | POA: Diagnosis not present

## 2022-06-28 DIAGNOSIS — I10 Essential (primary) hypertension: Secondary | ICD-10-CM

## 2022-06-28 MED ORDER — PANTOPRAZOLE SODIUM 40 MG PO TBEC: 40.0000 mg | DELAYED_RELEASE_TABLET | Freq: Every day | ORAL | 2 refills | Status: DC

## 2022-06-28 MED ORDER — BUDESONIDE-FORMOTEROL FUMARATE 80-4.5 MCG/ACT IN AERO: INHALATION_SPRAY | RESPIRATORY_TRACT | 12 refills | Status: DC

## 2022-06-28 MED ORDER — PREDNISONE 10 MG PO TABS: ORAL_TABLET | ORAL | 0 refills | Status: DC

## 2022-06-28 MED ORDER — AZITHROMYCIN 250 MG PO TABS: ORAL_TABLET | ORAL | 0 refills | Status: DC

## 2022-06-28 MED ORDER — TRIAMTERENE-HCTZ 37.5-25 MG PO TABS: 1.0000 | ORAL_TABLET | Freq: Every day | ORAL | 11 refills | Status: DC

## 2022-06-28 NOTE — Patient Instructions (Addendum)
Plan A = Automatic = Always=   Symbicort 80  Take 2 puffs first thing in am and then another 2 puffs about 12 hours later.     Plan B = Backup (to supplement plan A, not to replace it) Only use your albuterol inhaler as a rescue medication to be used if you can't catch your breath by resting or doing a relaxed purse lip breathing pattern.  - The less you use it, the better it will work when you need it. - Ok to use the inhaler up to 2 puffs  every 4 hours if you must but call for appointment if use goes up over your usual need - Don't leave home without it !!  (think of it like the spare tire for your car)   Plan C = Crisis (instead of Plan B but only if Plan B stops working) - only use your albuterol nebulizer if you first try Plan B and it fails to help > ok to use the nebulizer up to every 4 hours but if start needing it regularly call for immediate appointment   Prednisone 10 mg take  4 each am x 2 days,   2 each am x 2 days,  1 each am x 2 days and stop   Zpak   Mucinex cm 1200 mg every 12hours as needed   Triamterene one daily   Pantoprazole (protonix) 40 mg   Take  30-60 min before first meal of the day and Pepcid (famotidine)  20 mg after supper until return to office - this is the best way to tell whether stomach acid is contributing to your problem.    Please remember to go to the  x-ray department  for your tests - we will call you with the results when they are available    Please schedule a follow up office visit in 2 weeks, sooner if needed  [facility] office  with all medications /inhalers/ solutions in hand so we can verify exactly what you are taking. This includes all medications from all doctors and over the counters

## 2022-06-28 NOTE — Assessment & Plan Note (Signed)
06/28/2022  Moderate elevation in setting of emotional stress/ acute resp symptoms and pred exposure - 06/28/2022 rx maxzide 25 daily         Each maintenance medication was reviewed in detail including emphasizing most importantly the difference between maintenance and prns and under what circumstances the prns are to be triggered using an action plan format where appropriate.  Total time for H and P, chart review, counseling, reviewing hfa/neb device(s) and generating customized AVS unique to this acute office visit / same day charting = 42 min

## 2022-06-28 NOTE — Assessment & Plan Note (Signed)
Onset of refractory cough April 2021 p covid 05/2019  - flutter valve/ prone position sleeping rec 01/23/2020  - Sinus CT 02/04/2020 >>> neg - HRCT chest 10/262021 >>> mild bronchiectasis with micronodules - 02/06/2020 pt reported 90% improvement on symbicort 80 2bid plus flutter p augmentin x 10 days  -  Quant Igs  02/11/20  Nl -  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   FS  Level 109   Flare in setting of uri with reported purulent tracheobronchitis and did not employ action plan Rec  Plan A = Automatic = Always=   Symbicort 80  Take 2 puffs first thing in am and then another 2 puffs about 12 hours later.     Plan B = Backup (to supplement plan A, not to replace it) Only use your albuterol inhaler as a rescue medication to be used if you can't catch your breath by resting or doing a relaxed purse lip breathing pattern.  - The less you use it, the better it will work when you need it. - Ok to use the inhaler up to 2 puffs  every 4 hours if you must but call for appointment if use goes up over your usual need - Don't leave home without it !!  (think of it like the spare tire for your car)   Plan C = Crisis (instead of Plan B but only if Plan B stops working) - only use your albuterol nebulizer if you first try Plan B and it fails to help > ok to use the nebulizer up to every 4 hours but if start needing it regularly call for immediate appointment   Prednisone 10 mg take  4 each am x 2 days,   2 each am x 2 days,  1 each am x 2 days and stop   Zpak   Mucinex cm 1200 mg every 12hours as needed   Triamterene one daily   Pantoprazole (protonix) 40 mg   Take  30-60 min before first meal of the day and Pepcid (famotidine)  20 mg after supper until return to office - this is the best way to tell whether stomach acid is contributing to your problem.       Please schedule a follow up office visit in 2 weeks, with all meds in hand using a trust but verify approach to confirm accurate Medication   Reconciliation The principal here is that until we are certain that the  patients are doing what we've asked, it makes no sense to ask them to do more.

## 2022-06-28 NOTE — Addendum Note (Signed)
Addended by: Christinia Gully B on: 06/28/2022 01:01 PM   Modules accepted: Level of Service

## 2022-06-28 NOTE — Progress Notes (Signed)
Beth [name], female    DOB: 22-Mar-1966,  MRN: [id number]   Brief patient profile:  66  yowf  never smoker with h/o UACS  Resolved on just pepcid 20 mg at hs since 2011 which was last pulmonary ov   then covid 19 05/2019 while in [place] achy/cough/dizzy/ anosmia recovered  100% (except maybe smell) then April/May2021 cough / sob > saw PCP, nl cxr and no rx stayed about the same and tried zyrtec no change and started nexium 40 mg around 1st sept 2021 helped throat clearing 25% then EGD 12/24/19 Dr Ramon's partner  done by GI nl > continue nexium and referred to pulmonary clinic 01/09/2020 by Ephriam Jenkins    History of Present Illness  01/09/2020  Pulmonary/ 1st office eval/Schuyler Olden  S/p covid 05/2019  Chief Complaint  Patient presents with   Pulmonary Consult    increased SOB x 4-5 months. She states she has to clear her throat often. She has a prod cough with very minimal yellow sputum. She states "feels like something stuck in my chest".  Dyspnea:with minimal activity  Cough: immediately at hs p zyrtec in am / slt yellow mucus  Sleep: bed blocks  SABA use:  Albuterol helps some last used 2 hours  prior to OV  Though baseline hfa technique quite poor  Cat x 17 years no new exp  Ex asthma as child rec Please remember to go to the lab department   for your tests - we will call you with the results when they are available.  Breztri Take 2 puffs first thing in am and then another 2 puffs about 12 hours later.  Work on inhaler technique:   Only use your albuterol   Take nexium Take 30-60 min before first meal of the day and take pepcid 40 mg at least on hour before bedtime  GERD  Diet  Depomedrol 120 mg IM  Please schedule a follow up office visit in 2 weeks  Add:  Take delsym two tsp every 12 hours and supplement if needed with  Tylenol #3   up to  1 every 4 hours to suppress the urge to cough.  Once you have eliminated the cough for 3 straight days try reducing the Tylenol #3 first,   then the delsym as tolerated.        01/23/2020  f/u ov/[name] re: ? Bronchiectasis p covid  Chief Complaint  Patient presents with   Follow-up    having hard time coughing up phlegm & its green when pt does  Dyspnea:  Better with activity but doe x hills x 30 min hike  Cough: much worse at hs with minimal thick green mucus production Sleeping: better prone / no better tylenol #3  SABA use: 4 x daily - never pre or rechallenges  02: none  Still throat clearing  [place]elt much better in [place] 2 recent trips  rec mucinex dm 1200 mg every 12 hours and use the flutter valve up as much as tylenol #3 up to 2 at bedtime - sleep prone Change breztri to symbicort 80 Take 2 puffs first thing in am and then another 2 puffs about 12 hours later.  Work on inhaler technique:  relax and gently blow all the way out then take a nice smooth deep breath back in, triggering the inhaler at same time you start breathing in.  Hold for up to 5 seconds if you can. Blow out thru nose. Rinse and gargle with water  when done Augmentin 875 mg take one pill twice daily  X 10 days    01/30/20  Medrol x 6 days  02/04/20 - Sinus CT 02/04/2020 >>> neg - HRCT chest 10/262021 >>> mild bronchiectasis with micronodules   02/11/2020  f/u ov/ office/[name] re: bronchiectasis/ started nexium 3 week prior to OV  On symb 80 2bid then pruritic ash one week prior to [place]  Patient presents with   Follow-up    rash on back of legs, elbows, back of arms, feels like hairball throat  onset of globus x 4 days and "tastes mucus " but nothing comes up Dyspnea: not aerobic but not limited by doe   Cough: mostly throat clearing  Sleeping: not sleeping well but no resp c/o's SABA use: none  02: none  rec For drainage / throat tickle try take CHLORPHENIRAMINE  4 mg     Medrol repeat  depomedrol 120 mg IM  Stop nexium and take pepcid 20 mg one after meals and bedtime  See Dr Nevada Crane re rash Please remember to  go to the lab department : -  Quant Igs  02/11/20  Nl -  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   FS  Level 109           02/10/2022  f/u ov/[name] re: obst bronchiectasis since covid   maint on stymbicort 80 one puff each pm  and prn daytime  Chief Complaint  Patient presents with   Follow-up   Dyspnea:  symbicort 80 x 1 before ex  prevents any symptoms with ex even in cold weather  Cough: none  Sleeping: fine now  SABA use: not now  02: none  Rec  Cxr wnl  Symbicort 80 Take 2 puffs first thing in am and then another 2 puffs about 12 hours later ok to taper down but immediately up to full dose for any resp flares    06/28/2022  acute ov/[name] re: obst bronchiectasis  maint on symbicort 80 1 each am   (never increased as rec) Chief Complaint  Patient presents with   Acute Visit    SOB and cough x 6 weeks.  Seen at Urgent Care in [place], [place].  Rx prednisone and inj in clinic (6 weeks ago).  Seen again (2 weeks ago), rx Medrol.  Cough x 6d > smidge better p pred then worse rx  medrol / singulair then neb helped some Dyspnea:  worse since onset of cough Cough: green exp in am / no assoc sinuc cc  Sleeping: poorly due to cough  SABA use: way too much  02: none     No obvious day to day or daytime variability or assoc   mucus plugs or hemoptysis or cp  subjective wheeze or overt sinus or hb symptoms.    . Also denies any obvious fluctuation of symptoms with weather or environmental changes or other aggravating or alleviating factors except as outlined above   No unusual exposure hx or h/o childhood pna  or knowledge of premature birth.  Current Allergies, Complete Past Medical History, Past Surgical History, Family History, and Social History were reviewed in [employer] record.  ROS  The following are not active complaints unless bolded Hoarseness, sore throat, dysphagia, dental problems, itching, sneezing,  nasal congestion or discharge of excess mucus or purulent  secretions, ear ache,   fever, chills, sweats, unintended wt loss or wt gain, classically pleuritic or exertional cp,  orthopnea pnd or  arm/hand swelling  or leg swelling, presyncope, palpitations, abdominal pain, anorexia, nausea, vomiting, diarrhea  or change in bowel habits or change in bladder habits, change in stools or change in urine, dysuria, hematuria,  rash, arthralgias, visual complaints, headache, numbness, weakness or ataxia or problems with walking or coordination,  change in mood/stressed with mother's estate  or  memory.        Current Meds  Medication Sig   albuterol (VENTOLIN HFA) 108 (90 Base) MCG/ACT inhaler Inhale 2 puffs into the lungs every 6 (six) hours as needed for wheezing or shortness of breath.   ALPRAZolam (XANAX) 0.25 MG tablet Take 0.125 mg by mouth daily as needed for anxiety.    Aspirin-Caffeine (ANACIN) 400-32 MG TABS Take 2 tablets by mouth daily as needed (headaches/pain).   budesonide-formoterol (SYMBICORT) 80-4.5 MCG/ACT inhaler Inhale 2 puffs into the lungs in the morning and at bedtime. 2 puffs in the morning and 2 puffs about 12 hours later.   Cetirizine HCl (ZYRTEC ALLERGY PO) Take by mouth.   dextromethorphan-guaiFENesin (MUCINEX DM) 30-600 MG 12hr tablet Take 1 tablet by mouth 2 (two) times daily as needed for cough.   diphenhydrAMINE-PSE-APAP (BENADRYL ALLERGY/COLD PO) Take by mouth.   famotidine (PEPCID) 20 MG tablet One after bfast and one after supper (Patient taking differently: Take 20 mg by mouth at bedtime.)   fluticasone (FLONASE) 50 MCG/ACT nasal spray Place 1 spray into both nostrils daily.   montelukast (SINGULAIR) 10 MG tablet Take 10 mg by mouth at bedtime.   naproxen sodium (ALEVE) 220 MG tablet Take 220 mg by mouth daily as needed (headaches).   PARoxetine (PAXIL) 10 MG tablet Take 10 mg by mouth daily.                              Past Medical History:  Diagnosis Date   Anemia    GERD (gastroesophageal reflux disease)     Headache    Insomnia    Iron deficiency        Objective:    Wts  06/28/2022        113  02/10/2022       121  03/03/2021     113  02/26/2020     111 02/11/2020       113   01/23/20 114 lb 9.6 oz (52 kg)  01/09/20 114 lb 3.2 oz (51.8 kg)  12/05/19 116 lb 1.6 oz (52.7 kg)    Vital signs reviewed  06/28/2022  - Note at rest 02 sats  100% on RA  - note bp up  General appearance:    well dressed pleasant wf nad    HEENT : Oropharynx  clear          NECK :  without  apparent JVD/ palpable Nodes/TM    LUNGS: no acc muscle use,  Nl contour chest with junky insp/exp rhonchi    CV:  RRR  no s3 or murmur or increase in P2, and no edema   ABD:  soft and nontender with nl inspiratory excursion in the supine position. No bruits or organomegaly appreciated   MS:  Nl gait/ ext warm without deformities Or obvious joint restrictions  calf tenderness, cyanosis or clubbing    SKIN: warm and dry without lesions    NEURO:  alert, approp, nl sensorium with  no motor or cerebellar deficits apparent.          CXR PA and  Lateral:   06/28/2022 :    I personally reviewed images and impression is as follows:    Minimal non-specific increase in airway markings/ no as dz          Assessment

## 2022-07-01 ENCOUNTER — Telehealth: Payer: Self-pay | Admitting: Internal Medicine

## 2022-07-01 ENCOUNTER — Other Ambulatory Visit: Payer: Self-pay

## 2022-07-01 MED ORDER — FLUCONAZOLE 100 MG PO TABS: 100.0000 mg | ORAL_TABLET | Freq: Every day | ORAL | 0 refills | Status: DC

## 2022-07-01 NOTE — Telephone Encounter (Signed)
Sent in Diflucan to patients pharmacy. Patient is aware. NFN

## 2022-07-01 NOTE — Telephone Encounter (Signed)
Patient would like RX for Diflucan. Pharmacy is [facility]. Patient phone number is [phone].

## 2022-07-01 NOTE — Telephone Encounter (Signed)
Difluxan 100 mg daily x 3 days

## 2022-07-01 NOTE — Telephone Encounter (Signed)
Patients advises she is almost finished with antibiotic but feels like she is now getting a yeast infection. Patient would like a script of Diflucan sent to pharmacy. Dr. Melvyn Novas please advise if this is okay and what dosage   [facility] [place]

## 2022-07-14 ENCOUNTER — Ambulatory Visit: Payer: PRIVATE HEALTH INSURANCE | Admitting: Nurse Practitioner

## 2022-07-19 NOTE — Progress Notes (Unsigned)
Beth [name], female    DOB: 1965/05/29,  MRN: [id number]   Brief patient profile:  18 yowf  never smoker with h/o UACS  Resolved on just pepcid 20 mg at hs since 2011 which was last pulmonary ov   then covid 19 05/2019 while in [place] achy/cough/dizzy/ anosmia recovered  100% (except maybe smell) then April/May2021 cough / sob > saw PCP, nl cxr and no rx stayed about the same and tried zyrtec no change and started nexium 40 mg around 1st sept 2021 helped throat clearing 25% then EGD 12/24/19 Dr Ramon's partner  done by GI nl > continue nexium and referred to pulmonary clinic 01/09/2020 by Richarda Blade    History of Present Illness  01/09/2020  Pulmonary/ 1st office eval/Beth [name]  S/p covid 05/2019  Chief Complaint  Patient presents with   Pulmonary Consult    increased SOB x 4-5 months. She states she has to clear her throat often. She has a prod cough with very minimal yellow sputum. She states "feels like something stuck in my chest".  Dyspnea:with minimal activity  Cough: immediately at hs p zyrtec in am / slt yellow mucus  Sleep: bed blocks  SABA use:  Albuterol helps some last used 2 hours  prior to OV  Though baseline hfa technique quite poor  Cat x 17 years no new exp  Ex asthma as child rec Please remember to go to the lab department   for your tests - we will call you with the results when they are available.  Breztri Take 2 puffs first thing in am and then another 2 puffs about 12 hours later.  Work on inhaler technique:   Only use your albuterol   Take nexium Take 30-60 min before first meal of the day and take pepcid 40 mg at least on hour before bedtime  GERD  Diet  Depomedrol 120 mg IM  Please schedule a follow up office visit in 2 weeks  Add:  Take delsym two tsp every 12 hours and supplement if needed with  Tylenol #3   up to  1 every 4 hours to suppress the urge to cough.  Once you have eliminated the cough for 3 straight days try reducing the Tylenol #3 first,   then the delsym as tolerated.        01/23/2020  f/u ov/Saba Gomm re: ? Bronchiectasis p covid  Chief Complaint  Patient presents with   Follow-up    having hard time coughing up phlegm & its green when pt does  Dyspnea:  Better with activity but doe x hills x 30 min hike  Cough: much worse at hs with minimal thick green mucus production Sleeping: better prone / no better tylenol #3  SABA use: 4 x daily - never pre or rechallenges  02: none  Still throat clearing  [place]elt much better [place] 2 recent trips  rec mucinex dm 1200 mg every 12 hours and use the flutter valve up as much as tylenol #3 up to 2 at bedtime - sleep prone Change breztri to symbicort 80 Take 2 puffs first thing in am and then another 2 puffs about 12 hours later.  Work on inhaler technique:  relax and gently blow all the way out then take a nice smooth deep breath back in, triggering the inhaler at same time you start breathing in.  Hold for up to 5 seconds if you can. Blow out thru nose. Rinse and gargle with water when  done Augmentin 875 mg take one pill twice daily  X 10 days    01/30/20  Medrol x 6 days  02/04/20 - Sinus CT 02/04/2020 >>> neg - HRCT chest 10/262021 >>> mild bronchiectasis with micronodules   02/11/2020  f/u ov/[place] office/Katie Moch re: bronchiectasis/ started nexium 3 week prior to OV  On symb 80 2bid then pruritic ash one week prior to OV   Chief Complaint  Patient presents with   Follow-up    rash on back of legs, elbows, back of arms, feels like hairball throat  onset of globus x 4 days and "tastes mucus " but nothing comes up Dyspnea: not aerobic but not limited by doe   Cough: mostly throat clearing  Sleeping: not sleeping well but no resp c/o's SABA use: none  02: none  rec For drainage / throat tickle try take CHLORPHENIRAMINE  4 mg     Medrol repeat  depomedrol 120 mg IM  Stop nexium and take pepcid 20 mg one after meals and bedtime  See Dr Margo Aye re rash Please remember to  go to the lab department : -  Quant Igs  02/11/20  Nl -  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   FS  Level 109           02/10/2022  f/u ov/Beth [name] re: obst bronchiectasis since covid   maint on stymbicort 80 one puff each pm  and prn daytime  Chief Complaint  Patient presents with   Follow-up   Dyspnea:  symbicort 80 x 1 before ex  prevents any symptoms with ex even in cold weather  Cough: none  Sleeping: fine now  SABA use: not now  02: none  Rec  Cxr wnl  Symbicort 80 Take 2 puffs first thing in am and then another 2 puffs about 12 hours later ok to taper down but immediately up to full dose for any resp flares    06/28/2022  acute ov/Beth [name] re: obst bronchiectasis  maint on symbicort 80 1 each am   (never increased as rec) Chief Complaint  Patient presents with   Acute Visit    SOB and cough x 6 weeks.  Seen at Urgent Care in [place], [place].  Rx prednisone and inj in clinic (6 weeks ago).  Seen again (2 weeks ago), rx Medrol.  Cough x 6d > smidge better p pred then worse rx  medrol / singulair then neb helped some Dyspnea:  worse since onset of cough Cough: green exp in am / no assoc sinuc cc  Sleeping: poorly due to cough  SABA use: way too much  02: none  Rec Plan A = Automatic = Always=   Symbicort 80  Take 2 puffs first thing in am and then another 2 puffs about 12 hours later.  Plan B = Backup (to supplement plan A, not to replace it) Only use your albuterol inhaler as a rescue medication  Plan C = Crisis (instead of Plan B but only if Plan B stops working) - only use your albuterol nebulizer if you first try Plan B  Prednisone 10 mg take  4 each am x 2 days,   2 each am x 2 days,  1 each am x 2 days and stop  Zpak  Mucinex cm 1200 mg every 12hours as needed  Triamterene one daily  Pantoprazole (protonix) 40 mg   Take  30-60 min before first meal of the day and Pepcid (famotidine)  20 mg after supper until return  to office    Please schedule a follow up office visit in 2 weeks,  sooner if needed  [facility] office  with all medications /inhalers/ solutions in hand    07/20/2022  f/u ov/[place] office/Kyla Duffy re: obst bronchiectasis maint on symb 80 with good technique   Chief Complaint  Patient presents with   Follow-up    PT f/u states that she was doing well until 4.8 and symptoms returned (SOB, cough, wheezing, chest congestion). She has finished prednisone and z-pak previously given on 3/19   Dyspnea:  no aerobics/ walks sev times a week up to 45 min Cough: worse when walks in cold/ mucus turning green p rx zpak  Sleeping: worse x 48  SABA use: both hfa and neb and typically needs the hfa 3 am during flares  02: none      No other obvious day to day or daytime variability or assoc   mucus plugs or hemoptysis or cp or chest tightness,  or overt sinus or hb symptoms.    Also denies any obvious fluctuation of symptoms with weather or environmental changes or other aggravating or alleviating factors except as outlined above   No unusual exposure hx or h/o childhood pna/ asthma or knowledge of premature birth.  Current Allergies, Complete Past Medical History, Past Surgical History, Family History, and Social History were reviewed in [facility] electronic medical record.  ROS  The following are not active complaints unless bolded Hoarseness, sore throat, dysphagia, dental problems, itching, sneezing,  nasal congestion or discharge of excess mucus or purulent secretions, ear ache,   fever, chills, sweats, unintended wt loss or wt gain, classically pleuritic or exertional cp,  orthopnea pnd or arm/hand swelling  or leg swelling, presyncope, palpitations, abdominal pain, anorexia, nausea, vomiting, diarrhea  or change in bowel habits or change in bladder habits, change in stools or change in urine, dysuria, hematuria,  rash, arthralgias, visual complaints, headache, numbness, weakness or ataxia or problems with walking or coordination,  change in mood or  memory.         Current Meds  Medication Sig   albuterol (VENTOLIN HFA) 108 (90 Base) MCG/ACT inhaler Inhale 2 puffs into the lungs every 6 (six) hours as needed for wheezing or shortness of breath.   ALPRAZolam (XANAX) 0.25 MG tablet Take 0.125 mg by mouth daily as needed for anxiety.    Aspirin-Caffeine (ANACIN) 400-32 MG TABS Take 2 tablets by mouth daily as needed (headaches/pain).   budesonide-formoterol (SYMBICORT) 80-4.5 MCG/ACT inhaler Take 2 puffs first thing in am and then another 2 puffs about 12 hours later.   busPIRone (BUSPAR) 10 MG tablet Take 10 mg by mouth 2 (two) times daily.   Cetirizine HCl (ZYRTEC ALLERGY PO) Take by mouth.   dextromethorphan-guaiFENesin (MUCINEX DM) 30-600 MG 12hr tablet Take 1 tablet by mouth 2 (two) times daily as needed for cough.   diphenhydrAMINE-PSE-APAP (BENADRYL ALLERGY/COLD PO) Take by mouth.   famotidine (PEPCID) 20 MG tablet One after bfast and one after supper (Patient taking differently: Take 20 mg by mouth at bedtime.)   fluconazole (DIFLUCAN) 100 MG tablet Take 1 tablet (100 mg total) by mouth daily.   fluticasone (FLONASE) 50 MCG/ACT nasal spray Place 1 spray into both nostrils daily.   montelukast (SINGULAIR) 10 MG tablet Take 10 mg by mouth at bedtime.   naproxen sodium (ALEVE) 220 MG tablet Take 220 mg by mouth daily as needed (headaches).   pantoprazole (PROTONIX) 40 MG tablet Take 1 tablet (40  mg total) by mouth daily. Take 30-60 min before first meal of the day   PARoxetine (PAXIL) 10 MG tablet Take 10 mg by mouth daily.   triamterene-hydrochlorothiazide (MAXZIDE-25) 37.5-25 MG tablet Take 1 tablet by mouth daily.                               Past Medical History:  Diagnosis Date   Anemia    GERD (gastroesophageal reflux disease)    Headache    Insomnia    Iron deficiency        Objective:    Wts  07/20/2022       114  06/28/2022       113  02/10/2022       121  03/03/2021     113  02/26/2020     111 02/11/2020       113    01/23/20 114 lb 9.6 oz (52 kg)  01/09/20 114 lb 3.2 oz (51.8 kg)  12/05/19 116 lb 1.6 oz (52.7 kg)    Vital signs reviewed  07/20/2022  - Note at rest 02 sats  97% on RA   General appearance:    amb wf nad     HEENT : Oropharynx  clear     Nasal turbinates nl    NECK :  without  apparent JVD/ palpable Nodes/TM    LUNGS: no acc muscle use,  Nl contour chest with coarse exp  > insp rhonchi bilaterally    CV:  RRR  no s3 or murmur or increase in P2, and no edema   ABD:  soft and nontender    MS:  Nl gait/ ext warm without deformities Or obvious joint restrictions  calf tenderness, cyanosis or clubbing    SKIN: warm and dry without lesions    NEURO:  alert, approp, nl sensorium with  no motor or cerebellar deficits apparent.                Assessment

## 2022-07-20 ENCOUNTER — Ambulatory Visit (INDEPENDENT_AMBULATORY_CARE_PROVIDER_SITE_OTHER): Payer: PRIVATE HEALTH INSURANCE | Admitting: Internal Medicine

## 2022-07-20 ENCOUNTER — Encounter: Payer: Self-pay | Admitting: Internal Medicine

## 2022-07-20 VITALS — BP 124/68 | HR 52 | Ht 62.0 in | Wt 114.8 lb

## 2022-07-20 DIAGNOSIS — J45991 Cough variant asthma: Secondary | ICD-10-CM | POA: Diagnosis not present

## 2022-07-20 DIAGNOSIS — J479 Bronchiectasis, uncomplicated: Secondary | ICD-10-CM

## 2022-07-20 MED ORDER — PREDNISONE 10 MG PO TABS: ORAL_TABLET | ORAL | 0 refills | Status: DC

## 2022-07-20 MED ORDER — FLUCONAZOLE 100 MG PO TABS: ORAL_TABLET | ORAL | 2 refills | Status: DC

## 2022-07-20 MED ORDER — DOXYCYCLINE HYCLATE 100 MG PO TABS: 100.0000 mg | ORAL_TABLET | Freq: Two times a day (BID) | ORAL | 0 refills | Status: DC

## 2022-07-20 NOTE — Patient Instructions (Addendum)
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    Please schedule a follow up visit in 3 months but call sooner if needed with pfts on return

## 2022-07-20 NOTE — Assessment & Plan Note (Addendum)
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    Please schedule a follow up visit in 3 months but call sooner if needed with pfts on return   Each maintenance medication was reviewed in detail including emphasizing most importantly the difference between maintenance and prns and under what circumstances the prns are to be triggered using an action plan format where appropriate.  Total time for H and P, chart review, counseling, reviewing hfa device(s) and generating customized AVS unique to this office visit / same day charting > 30 min for multiple  refractory respiratory  symptoms of uncertain etiology

## 2022-08-29 ENCOUNTER — Other Ambulatory Visit (HOSPITAL_BASED_OUTPATIENT_CLINIC_OR_DEPARTMENT_OTHER): Payer: Self-pay

## 2022-08-29 DIAGNOSIS — J45991 Cough variant asthma: Secondary | ICD-10-CM

## 2022-08-29 DIAGNOSIS — J479 Bronchiectasis, uncomplicated: Secondary | ICD-10-CM

## 2022-08-30 ENCOUNTER — Ambulatory Visit (INDEPENDENT_AMBULATORY_CARE_PROVIDER_SITE_OTHER): Payer: PRIVATE HEALTH INSURANCE | Admitting: Internal Medicine

## 2022-08-30 DIAGNOSIS — J479 Bronchiectasis, uncomplicated: Secondary | ICD-10-CM

## 2022-08-30 DIAGNOSIS — J45991 Cough variant asthma: Secondary | ICD-10-CM | POA: Diagnosis not present

## 2022-08-30 LAB — PULMONARY FUNCTION TEST
DL/VA % pred: 97 %
DL/VA: 4.22 ml/min/mmHg/L
DLCO cor % pred: 109 %
DLCO cor: 20.26 ml/min/mmHg
DLCO unc % pred: 109 %
DLCO unc: 20.26 ml/min/mmHg
FEF 25-75 Post: 1.73 L/sec
FEF 25-75 Pre: 1.38 L/sec
FEF2575-%Change-Post: 25 %
FEF2575-%Pred-Post: 75 %
FEF2575-%Pred-Pre: 59 %
FEV1-%Change-Post: 8 %
FEV1-%Pred-Post: 97 %
FEV1-%Pred-Pre: 89 %
FEV1-Post: 2.3 L
FEV1-Pre: 2.12 L
FEV1FVC-%Change-Post: 7 %
FEV1FVC-%Pred-Pre: 87 %
FEV6-%Change-Post: 0 %
FEV6-%Pred-Post: 105 %
FEV6-%Pred-Pre: 104 %
FEV6-Post: 3.08 L
FEV6-Pre: 3.07 L
FEV6FVC-%Pred-Post: 103 %
FEV6FVC-%Pred-Pre: 103 %
FVC-%Change-Post: 0 %
FVC-%Pred-Post: 101 %
FVC-%Pred-Pre: 101 %
FVC-Post: 3.08 L
FVC-Pre: 3.07 L
Post FEV1/FVC ratio: 74 %
Post FEV6/FVC ratio: 100 %
Pre FEV1/FVC ratio: 69 %
Pre FEV6/FVC Ratio: 100 %
RV % pred: 134 %
RV: 2.39 L
TLC % pred: 116 %
TLC: 5.36 L

## 2022-08-30 NOTE — Progress Notes (Signed)
Ardelle Park, female    DOB: [birth year],  MRN: [id number]   Brief patient profile:  89 yowf  never smoker with h/o UACS  Resolved on just pepcid 20 mg at hs since 2011 which was last pulmonary ov   then covid 19 05/2019 while in [place] achy/cough/dizzy/ anosmia recovered  100% (except maybe smell) then April/May2021 cough / sob > saw PCP, nl cxr and no rx stayed about the same and tried zyrtec no change and started nexium 40 mg around 1st sept 2021 helped throat clearing 25% then EGD 12/24/19 Dr Ramon's partner  done by GI nl > continue nexium and referred to pulmonary clinic 01/09/2020 by Richarda Blade    History of Present Illness  01/09/2020  Pulmonary/ 1st office eval/Sameka Bagent  S/p covid 05/2019  Chief Complaint  Patient presents with   Pulmonary Consult    increased SOB x 4-5 months. She states she has to clear her throat often. She has a prod cough with very minimal yellow sputum. She states "feels like something stuck in my chest".  Dyspnea:with minimal activity  Cough: immediately at hs p zyrtec in am / slt yellow mucus  Sleep: bed blocks  SABA use:  Albuterol helps some last used 2 hours  prior to OV  Though baseline hfa technique quite poor  Cat x 17 years no new exp  Ex asthma as child rec Please remember to go to the lab department   for your tests - we will call you with the results when they are available.  Breztri Take 2 puffs first thing in am and then another 2 puffs about 12 hours later.  Work on inhaler technique:   Only use your albuterol   Take nexium Take 30-60 min before first meal of the day and take pepcid 40 mg at least on hour before bedtime  GERD  Diet  Depomedrol 120 mg IM  Please schedule a follow up office visit in 2 weeks  Add:  Take delsym two tsp every 12 hours and supplement if needed with  Tylenol #3   up to  1 every 4 hours to suppress the urge to cough.  Once you have eliminated the cough for 3 straight days try reducing the Tylenol #3 first,   then the delsym as tolerated.        01/23/2020  f/u ov/Marshaun Lortie re: ? Bronchiectasis p covid  Chief Complaint  Patient presents with   Follow-up    having hard time coughing up phlegm & its green when pt does  Dyspnea:  Better with activity but doe x hills x 30 min hike  Cough: much worse at hs with minimal thick green mucus production Sleeping: better prone / no better tylenol #3  SABA use: 4 x daily - never pre or rechallenges  02: none  Still throat clearing  Felt much better in mountains on 2 recent trips  rec mucinex dm 1200 mg every 12 hours and use the flutter valve up as much as tylenol #3 up to 2 at bedtime - sleep prone Change breztri to symbicort 80 Take 2 puffs first thing in am and then another 2 puffs about 12 hours later.  Work on inhaler technique:  relax and gently blow all the way out then take a nice smooth deep breath back in, triggering the inhaler at same time you start breathing in.  Hold for up to 5 seconds if you can. Blow out thru nose. Rinse and gargle with water when  done Augmentin 875 mg take one pill twice daily  X 10 days    01/30/20  Medrol x 6 days  02/04/20 - Sinus CT 02/04/2020 >>> neg - HRCT chest 10/262021 >>> mild bronchiectasis with micronodules   02/11/2020  f/u ov/[place] office/Maylene Crocker re: bronchiectasis/ started nexium 3 week prior to OV  On symb 80 2bid then pruritic ash one week prior to OV   Chief Complaint  Patient presents with   Follow-up    rash on back of legs, elbows, back of arms, feels like hairball throat  onset of globus x 4 days and "tastes mucus " but nothing comes up Dyspnea: not aerobic but not limited by doe   Cough: mostly throat clearing  Sleeping: not sleeping well but no resp c/o's SABA use: none  02: none  rec For drainage / throat tickle try take CHLORPHENIRAMINE  4 mg     Medrol repeat  depomedrol 120 mg IM  Stop nexium and take pepcid 20 mg one after meals and bedtime  See Dr Margo Aye re rash Please remember to  go to the lab department : -  Quant Igs  02/11/20  Nl -  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   FS  Level 109           02/10/2022  f/u ov/Kynnadi Dicenso re: obst bronchiectasis since covid   maint on stymbicort 80 one puff each pm  and prn daytime  Chief Complaint  Patient presents with   Follow-up   Dyspnea:  symbicort 80 x 1 before ex  prevents any symptoms with ex even in cold weather  Cough: none  Sleeping: fine now  SABA use: not now  02: none  Rec  Cxr wnl  Symbicort 80 Take 2 puffs first thing in am and then another 2 puffs about 12 hours later ok to taper down but immediately up to full dose for any resp flares    06/28/2022  acute ov/Jemarcus Dougal re: obst bronchiectasis  maint on symbicort 80 1 each am   (never increased as rec) Chief Complaint  Patient presents with   Acute Visit    SOB and cough x 6 weeks.  Seen at Urgent Care in [place], [place].  Rx prednisone and inj in clinic (6 weeks ago).  Seen again (2 weeks ago), rx Medrol.  Cough x 6d > smidge better p pred then worse rx  medrol / singulair then neb helped some Dyspnea:  worse since onset of cough Cough: green exp in am / no assoc sinuc cc  Sleeping: poorly due to cough  SABA use: way too much  02: none  Rec Plan A = Automatic = Always=   Symbicort 80  Take 2 puffs first thing in am and then another 2 puffs about 12 hours later.  Plan B = Backup (to supplement plan A, not to replace it) Only use your albuterol inhaler as a rescue medication  Plan C = Crisis (instead of Plan B but only if Plan B stops working) - only use your albuterol nebulizer if you first try Plan B  Prednisone 10 mg take  4 each am x 2 days,   2 each am x 2 days,  1 each am x 2 days and stop  Zpak  Mucinex cm 1200 mg every 12hours as needed  Triamterene one daily  Pantoprazole (protonix) 40 mg   Take  30-60 min before first meal of the day and Pepcid (famotidine)  20 mg after supper until return  to office    Please schedule a follow up office visit in 2 weeks,  sooner if needed  [facility] office  with all medications /inhalers/ solutions in hand    07/20/2022  f/u ov/[place] office/Michiel Sivley re: obst bronchiectasis maint on symb 80 with good technique   Chief Complaint  Patient presents with   Follow-up    PT f/u states that she was doing well until 4.8 and symptoms returned (SOB, cough, wheezing, chest congestion). 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   09/01/2022  f/u ov/[place] office/Ilianna Bown re: bronchiectasis/ FS maint on symbicort 80 2bid   Chief Complaint  Patient presents with   Follow-up    Pt f/u states that she is doing much better than LOV - only current concern is an ulcer on the roof of her mouth.   Dyspnea:  some ex but not aerobic but walks up to 45 min 3 x weekly  Cough: none  Sleeping: flat bed one pillow no prob SABA use: not at  02: none      No obvious day to day or daytime variability or assoc excess/ purulent sputum or mucus plugs or hemoptysis or cp or chest tightness, subjective wheeze or overt sinus or hb symptoms.   Sleeping  without nocturnal  or early am exacerbation  of respiratory  c/o's or need for noct saba. Also denies any obvious fluctuation of symptoms with weather or environmental changes or other aggravating or alleviating factors except as outlined above   No unusual exposure hx or h/o  childhood pna/ asthma or knowledge of premature birth.  Current Allergies, Complete Past Medical History, Past Surgical History, Family History, and Social History were reviewed in [employer] record.  ROS  The following are not active complaints unless bolded Hoarseness, sore throat, dysphagia, dental problems, itching, sneezing,  nasal congestion or discharge of excess mucus or purulent secretions, ear ache,   fever, chills, sweats, unintended wt loss or wt gain, classically pleuritic or exertional cp,  orthopnea pnd or arm/hand swelling  or leg swelling, presyncope, palpitations, abdominal pain, anorexia, nausea, vomiting, diarrhea  or change in bowel habits or change in bladder habits, change in stools or change in urine, dysuria, hematuria,  rash, arthralgias, visual complaints, headache, numbness, weakness or ataxia or problems with walking or coordination,  change in mood or  memory.        Current Meds  Medication Sig   albuterol (VENTOLIN HFA) 108 (90 Base) MCG/ACT inhaler Inhale 2 puffs into the lungs every 6 (six) hours as needed for wheezing or shortness of breath.   ALPRAZolam (XANAX) 0.25 MG tablet Take 0.125 mg by mouth daily as needed for anxiety.  Aspirin-Caffeine (ANACIN) 400-32 MG TABS Take 2 tablets by mouth daily as needed (headaches/pain).   budesonide-formoterol (SYMBICORT) 80-4.5 MCG/ACT inhaler Take 2 puffs first thing in am and then another 2 puffs about 12 hours later.   busPIRone (BUSPAR) 10 MG tablet Take 10 mg by mouth 2 (two) times daily.   Cetirizine HCl (ZYRTEC ALLERGY PO) Take by mouth.   dextromethorphan-guaiFENesin (MUCINEX DM) 30-600 MG 12hr tablet Take 1 tablet by mouth 2 (two) times daily as needed for cough.   diphenhydrAMINE-PSE-APAP (BENADRYL ALLERGY/COLD PO) Take by mouth.   famotidine (PEPCID) 20 MG tablet One after bfast and one after supper (Patient taking differently: Take 20 mg by mouth at bedtime.)   fluticasone (FLONASE) 50  MCG/ACT nasal spray Place 1 spray into both nostrils daily.   naproxen sodium (ALEVE) 220 MG tablet Take 220 mg by mouth daily as needed (headaches).   PARoxetine (PAXIL) 10 MG tablet Take 10 mg by mouth daily.                            Past Medical History:  Diagnosis Date   Anemia    GERD (gastroesophageal reflux disease)    Headache    Insomnia    Iron deficiency        Objective:    Wts  09/01/2022       116  07/20/2022       114  06/28/2022       113  02/10/2022       121  03/03/2021     113  02/26/2020     111 02/11/2020       113   01/23/20 114 lb 9.6 oz (52 kg)  01/09/20 114 lb 3.2 oz (51.8 kg)  12/05/19 116 lb 1.6 oz (52.7 kg)    Vital signs reviewed  09/01/2022  - Note at rest 02 sats  98% on RA   General appearance:    amb pleasant wf nad     HEENT : Oropharynx  clear/ no ulcers or thrush        NECK :  without  apparent JVD/ palpable Nodes/TM    LUNGS: no acc muscle use,  Nl contour chest which is clear to A and P bilaterally without cough on insp or exp maneuvers   CV:  RRR  no s3 or murmur or increase in P2, and no edema   ABD:  soft and nontender    MS:  Nl gait/ ext warm without deformities Or obvious joint restrictions  calf tenderness, cyanosis or clubbing    SKIN: warm and dry without lesions    NEURO:  alert, approp, nl sensorium with  no motor or cerebellar deficits apparent.                 Assessment

## 2022-08-30 NOTE — Patient Instructions (Signed)
Full PFT Performed Today  

## 2022-08-30 NOTE — Progress Notes (Signed)
Full PFT Performed Today  

## 2022-09-01 ENCOUNTER — Encounter: Payer: Self-pay | Admitting: Internal Medicine

## 2022-09-01 ENCOUNTER — Encounter: Payer: Self-pay | Admitting: *Deleted

## 2022-09-01 ENCOUNTER — Ambulatory Visit (INDEPENDENT_AMBULATORY_CARE_PROVIDER_SITE_OTHER): Payer: PRIVATE HEALTH INSURANCE | Admitting: Internal Medicine

## 2022-09-01 VITALS — BP 129/74 | HR 71 | Ht 62.0 in | Wt 116.2 lb

## 2022-09-01 DIAGNOSIS — J45991 Cough variant asthma: Secondary | ICD-10-CM | POA: Diagnosis not present

## 2022-09-01 DIAGNOSIS — J479 Bronchiectasis, uncomplicated: Secondary | ICD-10-CM | POA: Diagnosis not present

## 2022-09-01 NOTE — Progress Notes (Signed)
Mychart msg sent to pt with results

## 2022-09-01 NOTE — Patient Instructions (Addendum)
Ok to reduce your Symbicort 80 to 1-2 puffs every 12 hours to see if you do as well without more mouth irritation    Please schedule a follow up visit in 6 months but call sooner if needed

## 2022-09-01 NOTE — Assessment & Plan Note (Signed)
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  Total time for H and P, chart review, counseling, reviewing hfa device(s) and generating customized AVS unique to this office visit / same day charting = 20 min

## 2022-09-01 NOTE — Assessment & Plan Note (Signed)
Flared x 08/2019 p covid 05/2019  - 01/09/2020   start breztri 2bid x 2 weeks then return - Allergy profile 01/09/2020 >  Eos 0.9 /  IgE  46 then repeat level  203 on 02/11/20 with active rash - 01/23/2020  After extensive coaching inhaler device,  effectiveness =    75% > change to symb 80 2bid as breathing better but not the cough ? Bronchiectasis  > see sep a/p - 02/26/2020  After extensive coaching inhaler device,  effectiveness =    90%> continue symb 80 2bid   -Allergy referral 02/26/2020 > not done  - 07/20/2022  After extensive coaching inhaler device,  effectiveness =    90% hfa    All goals of chronic asthma control met including optimal function and elimination of symptoms with minimal need for rescue therapy.  Contingencies discussed in full including contacting this office immediately if not controlling the symptoms using the rule of two's.     Ok to reduce symbicort 80 to 1-2 bid Based on two studies from NEJM  378; 20 p 1865 (2018) and 380 : p2020-30 (2019) in pts with mild asthma it is reasonable to use low dose symbicort eg 80 2bid "prn" flare in this setting but I emphasized this was only shown with symbicort and takes advantage of the rapid onset of action but is not the same as "rescue therapy" but can be stopped once the acute symptoms have resolved and the need for rescue has been minimized (< 2 x weekly)

## 2022-10-31 ENCOUNTER — Ambulatory Visit: Payer: PRIVATE HEALTH INSURANCE | Admitting: Internal Medicine

## 2023-02-13 ENCOUNTER — Ambulatory Visit: Payer: PRIVATE HEALTH INSURANCE | Admitting: Internal Medicine

## 2023-02-14 ENCOUNTER — Ambulatory Visit (INDEPENDENT_AMBULATORY_CARE_PROVIDER_SITE_OTHER): Payer: PRIVATE HEALTH INSURANCE | Admitting: Internal Medicine

## 2023-02-14 ENCOUNTER — Encounter: Payer: Self-pay | Admitting: Internal Medicine

## 2023-02-14 VITALS — BP 98/65 | HR 64 | Ht 62.0 in | Wt 112.0 lb

## 2023-02-14 DIAGNOSIS — J45991 Cough variant asthma: Secondary | ICD-10-CM | POA: Diagnosis not present

## 2023-02-14 DIAGNOSIS — L309 Dermatitis, unspecified: Secondary | ICD-10-CM | POA: Insufficient documentation

## 2023-02-14 DIAGNOSIS — J479 Bronchiectasis, uncomplicated: Secondary | ICD-10-CM | POA: Diagnosis not present

## 2023-02-14 DIAGNOSIS — F419 Anxiety disorder, unspecified: Secondary | ICD-10-CM | POA: Insufficient documentation

## 2023-02-14 DIAGNOSIS — K219 Gastro-esophageal reflux disease without esophagitis: Secondary | ICD-10-CM | POA: Insufficient documentation

## 2023-02-14 DIAGNOSIS — D509 Iron deficiency anemia, unspecified: Secondary | ICD-10-CM | POA: Insufficient documentation

## 2023-02-14 DIAGNOSIS — K5900 Constipation, unspecified: Secondary | ICD-10-CM | POA: Insufficient documentation

## 2023-02-14 DIAGNOSIS — M2010 Hallux valgus (acquired), unspecified foot: Secondary | ICD-10-CM | POA: Insufficient documentation

## 2023-02-14 DIAGNOSIS — K2100 Gastro-esophageal reflux disease with esophagitis, without bleeding: Secondary | ICD-10-CM | POA: Insufficient documentation

## 2023-02-14 DIAGNOSIS — D649 Anemia, unspecified: Secondary | ICD-10-CM | POA: Insufficient documentation

## 2023-02-14 DIAGNOSIS — G4700 Insomnia, unspecified: Secondary | ICD-10-CM | POA: Insufficient documentation

## 2023-02-14 NOTE — Assessment & Plan Note (Signed)
Flared x 08/2019 p covid 05/2019  - 01/09/2020   start breztri 2bid x 2 weeks then return - Allergy profile 01/09/2020 >  Eos 0.9 /  IgE  46 then repeat level  203 on 02/11/20 with active rash - 01/23/2020  After extensive coaching inhaler device,  effectiveness =    75% > change to symb 80 2bid as breathing better but not the cough ? Bronchiectasis  > see sep a/p - 02/26/2020  After extensive coaching inhaler device,  effectiveness =    90%> continue symb 80 2bid   -Allergy referral 02/26/2020 > not done  - 07/20/2022  After extensive coaching inhaler device,  effectiveness =    90% hfa    All goals of chronic asthma control met including optimal function and elimination of symptoms with minimal need for rescue therapy.  Contingencies discussed in full including contacting this office immediately if not controlling the symptoms using the rule of two's.     Continue symb 80 2bid prn Based on two studies from NEJM  378; 20 p 1865 (2018) and 380 : p2020-30 (2019) in pts with mild asthma it is reasonable to use low dose symbicort eg 80 2bid "prn" flare in this setting but I emphasized this was only shown with symbicort and takes advantage of the rapid onset of action but is not the same as "rescue therapy" but can be stopped once the acute symptoms have resolved and the need for rescue has been minimized (< 2 x weekly)

## 2023-02-14 NOTE — Patient Instructions (Signed)
No change in medications   Please schedule a follow up visit in 12  months but call sooner if needed  

## 2023-02-14 NOTE — Progress Notes (Signed)
Beth [name], female    DOB: 1965/04/15,  MRN: [id number]   Brief patient profile:  36 yowf  never smoker with h/o UACS  Resolved on just pepcid 20 mg at hs since 2011 which was last pulmonary ov   then covid 19 05/2019 while in [place] achy/cough/dizzy/ anosmia recovered  100% (except maybe smell) then April/May2021 cough / sob > saw PCP, nl cxr and no rx stayed about the same and tried zyrtec no change and started nexium 40 mg around 1st sept 2021 helped throat clearing 25% then EGD 12/24/19 Dr Ramon's partner  done by GI nl > continue nexium and referred to pulmonary clinic 01/09/2020 by Richarda Blade    History of Present Illness  01/09/2020  Pulmonary/ 1st office eval/Brighid Koch  S/p covid 05/2019  Chief Complaint  Patient presents with   Pulmonary Consult    increased SOB x 4-5 months. She states she has to clear her throat often. She has a prod cough with very minimal yellow sputum. She states "feels like something stuck in my chest".  Dyspnea:with minimal activity  Cough: immediately at hs p zyrtec in am / slt yellow mucus  Sleep: bed blocks  SABA use:  Albuterol helps some last used 2 hours  prior to OV  Though baseline hfa technique quite poor  Cat x 17 years no new exp  Ex asthma as child rec Please remember to go to the lab department   for your tests - we will call you with the results when they are available.  Breztri Take 2 puffs first thing in am and then another 2 puffs about 12 hours later.  Work on inhaler technique:   Only use your albuterol   Take nexium Take 30-60 min before first meal of the day and take pepcid 40 mg at least on hour before bedtime  GERD  Diet  Depomedrol 120 mg IM  Please schedule a follow up office visit in 2 weeks  Add:  Take delsym two tsp every 12 hours and supplement if needed with  Tylenol #3   up to  1 every 4 hours to suppress the urge to cough.  Once you have eliminated the cough for 3 straight days try reducing the Tylenol #3 first,   then the delsym as tolerated.        01/23/2020  f/u ov/[name] re: ? Bronchiectasis p covid  Chief Complaint  Patient presents with   Follow-up    having hard time coughing up phlegm & its green when pt does  Dyspnea:  Better with activity but doe x hills x 30 min hike  Cough: much worse at hs with minimal thick green mucus production Sleeping: better prone / no better tylenol #3  SABA use: 4 x daily - never pre or rechallenges  02: none  Still throat clearing  Felt much better in [place] on 2 recent trips  rec mucinex dm 1200 mg every 12 hours and use the flutter valve up as much as tylenol #3 up to 2 at bedtime - sleep prone Change breztri to symbicort 80 Take 2 puffs first thing in am and then another 2 puffs about 12 hours later.  Work on inhaler technique:  relax and gently blow all the way out then take a nice smooth deep breath back in, triggering the inhaler at same time you start breathing in.  Hold for up to 5 seconds if you can. Blow out thru nose. Rinse and gargle with water when  done Augmentin 875 mg take one pill twice daily  X 10 days    01/30/20  Medrol x 6 days  02/04/20 - Sinus CT 02/04/2020 >>> neg - HRCT chest 10/262021 >>> mild bronchiectasis with micronodules   02/11/2020  f/u ov/[name] office/Malvika Tung re: bronchiectasis/ started nexium 3 week prior to OV  On symb 80 2bid then pruritic ash one week prior to OV   Chief Complaint  Patient presents with   Follow-up    rash on back of legs, elbows, back of arms, feels like hairball throat  onset of globus x 4 days and "tastes mucus " but nothing comes up Dyspnea: not aerobic but not limited by doe   Cough: mostly throat clearing  Sleeping: not sleeping well but no resp c/o's SABA use: none  02: none  rec For drainage / throat tickle try take CHLORPHENIRAMINE  4 mg     Medrol repeat  Depomedrol 120 mg IM  Stop nexium and take pepcid 20 mg one after meals and bedtime  See Dr Margo Aye re rash Please remember to  go to the lab department : -  Quant Igs  02/11/20  Nl -  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   FS  Level 109           02/10/2022  f/u ov/Cela Newcom re: obst bronchiectasis since covid   maint on stymbicort 80 one puff each pm  and prn daytime  Chief Complaint  Patient presents with   Follow-up   Dyspnea:  symbicort 80 x 1 before ex  prevents any symptoms with ex even in cold weather  Cough: none  Sleeping: fine now  SABA use: not now  02: none  Rec  Cxr wnl  Symbicort 80 Take 2 puffs first thing in am and then another 2 puffs about 12 hours later ok to taper down but immediately up to full dose for any resp flares    06/28/2022  acute ov/[name] re: obst bronchiectasis  maint on symbicort 80 1 each am   (never increased as rec) Chief Complaint  Patient presents with   Acute Visit    SOB and cough x 6 weeks.  Seen at Urgent Care in [place], [place].  Rx prednisone and inj in clinic (6 weeks ago).  Seen again (2 weeks ago), rx Medrol.  Cough x 6d > smidge better p pred then worse rx  medrol / singulair then neb helped some Dyspnea:  worse since onset of cough Cough: green exp in am / no assoc sinuc cc  Sleeping: poorly due to cough  SABA use: way too much  02: none  Rec Plan A = Automatic = Always=   Symbicort 80  Take 2 puffs first thing in am and then another 2 puffs about 12 hours later.  Plan B = Backup (to supplement plan A, not to replace it) Only use your albuterol inhaler as a rescue medication  Plan C = Crisis (instead of Plan B but only if Plan B stops working) - only use your albuterol nebulizer if you first try Plan B  Prednisone 10 mg take  4 each am x 2 days,   2 each am x 2 days,  1 each am x 2 days and stop  Zpak  Mucinex cm 1200 mg every 12hours as needed  Triamterene one daily  Pantoprazole (protonix) 40 mg   Take  30-60 min before first meal of the day and Pepcid (famotidine)  20 mg after supper until return  to office    Please schedule a follow up office visit in 2 weeks,  sooner if needed  [facility] office  with all medications /inhalers/ solutions in hand    09/01/2022  f/u ov/[place] office/[name] re: bronchiectasis/ FS maint on symbicort 80 2bid   Chief Complaint  Patient presents with   Follow-up    Pt f/u states that she is doing much better than LOV - only current concern is an ulcer on the roof of her mouth.   Dyspnea:  some ex but not aerobic but walks up to 45 min 3 x weekly  Cough: none  Sleeping: flat bed one pillow no prob SABA use: not at  02: none  Rec Ok to reduce your Symbicort 80 to 1-2 puffs every 12 hours to see if you do as well without more mouth irritation  Please schedule a follow up visit in 6 months but call sooner if needed    02/14/2023  f/u ov/[place] office/Filipe Greathouse re: bronchiectasis/ abn  maint on symbicort 80  2 q pm   Chief Complaint  Patient presents with   Cough variant asthma with component uacs   Dyspnea:  Not limited by breathing from desired activities / no true aerobic  Cough: resolved  Sleeping: flat bed one pillow s    resp cc  SABA use: none / freq skips am symbicort  02: none    No obvious day to day or daytime variability or assoc excess/ purulent sputum or mucus plugs or hemoptysis or cp or chest tightness, subjective wheeze or overt sinus or hb symptoms.    Also denies any obvious fluctuation of symptoms with weather or environmental changes or other aggravating or alleviating factors except as outlined above   No unusual exposure hx or h/o childhood pna/ asthma or knowledge of premature birth.  Current Allergies, Complete Past Medical History, Past Surgical History, Family History, and Social History were reviewed in [employer] record.  ROS  The following are not active complaints unless bolded Hoarseness, sore throat, dysphagia, dental problems, itching, sneezing,  nasal congestion or discharge of excess mucus or purulent secretions, ear ache,   fever, chills, sweats, unintended  wt loss or wt gain, classically pleuritic or exertional cp,  orthopnea pnd or arm/hand swelling  or leg swelling, presyncope, palpitations, abdominal pain, anorexia, nausea, vomiting, diarrhea  or change in bowel habits or change in bladder habits, change in stools or change in urine, dysuria, hematuria,  rash, arthralgias, visual complaints, headache, numbness, weakness or ataxia or problems with walking or coordination,  change in mood or  memory.        Current Meds  Medication Sig   albuterol (VENTOLIN HFA) 108 (90 Base) MCG/ACT inhaler Inhale 2 puffs into the lungs every 6 (six) hours as needed for wheezing or shortness of breath.   ALPRAZolam (XANAX) 0.25 MG tablet Take 0.125 mg by mouth daily as needed for anxiety.    Aspirin-Caffeine (ANACIN) 400-32 MG TABS Take 2 tablets by mouth daily as needed (headaches/pain).   budesonide-formoterol (SYMBICORT) 80-4.5 MCG/ACT inhaler Take 2 puffs first thing in am and then another 2 puffs about 12 hours later.   busPIRone (BUSPAR) 10 MG tablet Take 10 mg by mouth 2 (two) times daily.   Cetirizine HCl (ZYRTEC ALLERGY PO) Take by mouth.   diphenhydrAMINE-PSE-APAP (BENADRYL ALLERGY/COLD PO) Take by mouth.   famotidine (PEPCID) 20 MG tablet One after bfast and one after supper (Patient taking differently: Take 20 mg by mouth  at bedtime.)   PARoxetine (PAXIL) 20 MG tablet Take 20 mg by mouth daily.   [DISCONTINUED] nystatin ointment (MYCOSTATIN) Apply 1 Application topically 2 (two) times daily.   [DISCONTINUED] PREMARIN vaginal cream Place 1 applicator vaginally at bedtime.           Past Medical History:  Diagnosis Date   Anemia    GERD (gastroesophageal reflux disease)    Headache    Insomnia    Iron deficiency        Objective:    Wts  02/14/2023       112  09/01/2022       116  07/20/2022       114  06/28/2022       113  02/10/2022       121  03/03/2021     113  02/26/2020     111 02/11/2020       113   01/23/20 114 lb 9.6 oz (52 kg)   01/09/20 114 lb 3.2 oz (51.8 kg)  12/05/19 116 lb 1.6 oz (52.7 kg)    Vital signs reviewed  02/14/2023  - Note at rest 02 sats  96% on RA   General appearance:    healthy appearing amb wf nad    HEENT : Oropharynx  clear      Nasal turbinates nl    NECK :  without  apparent JVD/ palpable Nodes/TM    LUNGS: no acc muscle use,  Nl contour chest which is clear to A and P bilaterally without cough on insp or exp maneuvers   CV:  RRR  no s3 or murmur or increase in P2, and no edema   ABD:  soft and nontender   MS:  Nl gait/ ext warm without deformities Or obvious joint restrictions  calf tenderness, cyanosis or clubbing    SKIN: warm and dry without lesions    NEURO:  alert, approp, nl sensorium with  no motor or cerebellar deficits apparent.         Assessment

## 2023-02-14 NOTE — Assessment & Plan Note (Signed)
Onset of refractory cough April 2021 p covid 05/2019  - flutter valve/ prone position sleeping rec 01/23/2020  - Sinus CT 02/04/2020 >>> neg - HRCT chest 10/262021 >>> mild bronchiectasis with micronodules - 02/06/2020 pt reported 90% improvement on symbicort 80 2bid plus flutter p augmentin x 10 days  -  Quant Igs  02/11/20  Nl -  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   FS  Level 109  - PFT's  08/30/2022   FEV1 2.30 (97 % ) ratio 0.74  p 8 % improvement from saba p 0 prior to study with DLCO  20.2 (109%)   and FV curve slt concave    No obstruction / good control of bronchiectasis so no indication for replacement rx / discussed  F/u can be yearly at this point - call sooner prn     Each maintenance medication was reviewed in detail including emphasizing most importantly the difference between maintenance and prns and under what circumstances the prns are to be triggered using an action plan format where appropriate.  Total time for H and P, chart review, counseling, reviewing hfa device(s) and generating customized AVS unique to this office visit / same day charting = 30 min

## 2023-03-07 ENCOUNTER — Ambulatory Visit: Payer: PRIVATE HEALTH INSURANCE | Admitting: Internal Medicine

## 2023-08-04 ENCOUNTER — Other Ambulatory Visit: Payer: Self-pay | Admitting: Internal Medicine

## 2024-02-08 ENCOUNTER — Ambulatory Visit: Payer: PRIVATE HEALTH INSURANCE | Admitting: Internal Medicine

## 2024-02-12 ENCOUNTER — Ambulatory Visit: Payer: PRIVATE HEALTH INSURANCE | Admitting: Internal Medicine

## 2024-02-22 ENCOUNTER — Encounter: Payer: Self-pay | Admitting: Internal Medicine

## 2024-02-22 ENCOUNTER — Ambulatory Visit (INDEPENDENT_AMBULATORY_CARE_PROVIDER_SITE_OTHER): Payer: PRIVATE HEALTH INSURANCE | Admitting: Internal Medicine

## 2024-02-22 VITALS — BP 125/71 | HR 64 | Ht 62.0 in | Wt 112.4 lb

## 2024-02-22 DIAGNOSIS — J479 Bronchiectasis, uncomplicated: Secondary | ICD-10-CM

## 2024-02-22 DIAGNOSIS — J45991 Cough variant asthma: Secondary | ICD-10-CM | POA: Diagnosis not present

## 2024-02-22 MED ORDER — BUDESONIDE-FORMOTEROL FUMARATE 80-4.5 MCG/ACT IN AERO: INHALATION_SPRAY | RESPIRATORY_TRACT | 11 refills | Status: AC

## 2024-02-22 NOTE — Patient Instructions (Addendum)
 Continue symbicort  80 2 puffs at bedtime with AM doses optional for any flares  Please schedule a follow up visit in 12  months but call sooner if needed

## 2024-02-22 NOTE — Progress Notes (Unsigned)
 Beth [name], female    DOB: 1965/05/10,  MRN: [id number]   Brief patient profile:  89 yowf  never smoker with h/o UACS  Resolved on just pepcid  20 mg at hs since 2011 which was last pulmonary ov   then covid 19 05/2019 while in [place] achy/cough/dizzy/ anosmia recovered  100% (except maybe smell) then April/May2021 cough / sob > saw PCP, nl cxr and no rx stayed about the same and tried zyrtec no change and started nexium  40 mg around 1st sept 2021 helped throat clearing 25% then EGD 12/24/19 Dr Ramon's partner  done by GI nl > continue nexium  and referred to pulmonary clinic 01/09/2020 by Lawrnce Holmes    History of Present Illness  01/09/2020  Pulmonary/ 1st office eval/Beth [name]  S/p covid 05/2019  Chief Complaint  Patient presents with   Pulmonary Consult    increased SOB x 4-5 months. She states she has to clear her throat often. She has a prod cough with very minimal yellow sputum. She states feels like something stuck in my chest.  Dyspnea:with minimal activity  Cough: immediately at hs p zyrtec in am / slt yellow mucus  Sleep: bed blocks  SABA use:  Albuterol helps some last used 2 hours  prior to OV  Though baseline hfa technique quite poor  Cat x 17 years no new exp  Ex asthma as child rec Please remember to go to the lab department   for your tests - we will call you with the results when they are available.  Breztri  Take 2 puffs first thing in am and then another 2 puffs about 12 hours later.  Work on inhaler technique:   Only use your albuterol   Take nexium  Take 30-60 min before first meal of the day and take pepcid  40 mg at least on hour before bedtime  GERD  Diet  Depomedrol 120 mg IM  Please schedule a follow up office visit in 2 weeks  Add:  Take delsym two tsp every 12 hours and supplement if needed with  Tylenol  #3   up to  1 every 4 hours to suppress the urge to cough.  Once you have eliminated the cough for 3 straight days try reducing the Tylenol  #3 first,   then the delsym as tolerated.           02/04/20 - Sinus CT 02/04/2020 >>> neg - HRCT chest 10/262021 >>> mild bronchiectasis with micronodules   02/11/2020  f/u ov/[place] office/Beth [name] re: bronchiectasis/ started nexium  3 week prior to OV  On symb 80 2bid then pruritic ash one week prior to OV   Chief Complaint  Patient presents with   Follow-up    rash on back of legs, elbows, back of arms, feels like hairball throat  onset of globus x 4 days and tastes mucus  but nothing comes up Dyspnea: not aerobic but not limited by doe   Cough: mostly throat clearing  Sleeping: not sleeping well but no resp c/o's SABA use: none  02: none  rec For drainage / throat tickle try take CHLORPHENIRAMINE  4 mg     Medrol  repeat  Depomedrol 120 mg IM  Stop nexium  and take pepcid  20 mg one after meals and bedtime  See Dr Shona re rash Please remember to go to the lab department : -  Quant Igs  02/11/20  Nl -  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   FS  Level 109  06/28/2022  acute ov/Beth [name] re: obst bronchiectasis  maint on symbicort  80 1 each am   (never increased as rec) Chief Complaint  Patient presents with   Acute Visit    SOB and cough x 6 weeks.  Seen at Urgent Care in [place], [place].  Rx prednisone  and inj in clinic (6 weeks ago).  Seen again (2 weeks ago), rx Medrol .  Cough x 6d > smidge better p pred then worse rx  medrol  / singulair then neb helped some Dyspnea:  worse since onset of cough Cough: green exp in am / no assoc sinuc cc  Sleeping: poorly due to cough  SABA use: way too much  02: none  Rec Plan A = Automatic = Always=   Symbicort  80  Take 2 puffs first thing in am and then another 2 puffs about 12 hours later.  Plan B = Backup (to supplement plan A, not to replace it) Only use your albuterol inhaler as a rescue medication  Plan C = Crisis (instead of Plan B but only if Plan B stops working) - only use your albuterol nebulizer if you first try Plan B   Prednisone  10 mg take  4 each am x 2 days,   2 each am x 2 days,  1 each am x 2 days and stop  Zpak  Mucinex cm 1200 mg every 12hours as needed  Triamterene  one daily  Pantoprazole  (protonix ) 40 mg   Take  30-60 min before first meal of the day and Pepcid  (famotidine )  20 mg after supper until return to office    Please schedule a follow up office visit in 2 weeks, sooner if needed  [facility] office  with all medications /inhalers/ solutions in hand        02/14/2023  f/u ov/[place] office/Beth [name] re: bronchiectasis   maint on symbicort  80  2 q pm  Chief Complaint  Patient presents with   Cough variant asthma with component uacs   Dyspnea:  Not limited by breathing from desired activities / no true aerobic  Cough: resolved  Sleeping: flat bed one pillow s    resp cc  SABA use: none / freq skips am symbicort   02: none  Rec No change rx     02/22/2024 Yearly  ov/Beth [name] re: obst bronchiectasis p covid    maint on symbicort  80 2  each am   Chief Complaint  Patient presents with   Cough    1 yr follow up doing - no pulm issues   Dyspnea:  treadmill 2-3 x per week at 2. 5 mph and little incline  and does 10 min s stopping  Cough: none  Sleeping: flat bed with a wedge and one pillow s  resp cc  SABA use: none    No obvious day to day or daytime variability or assoc excess/ purulent sputum or mucus plugs or hemoptysis or cp or chest tightness, subjective wheeze or overt sinus or hb symptoms.    Also denies any obvious fluctuation of symptoms with weather or environmental changes or other aggravating or alleviating factors except as outlined above   No unusual exposure hx or h/o childhood pna/ asthma or knowledge of premature birth.  Current Allergies, Complete Past Medical History, Past Surgical History, Family History, and Social History were reviewed in [employer] record.  ROS  The following are not active complaints unless bolded Hoarseness, sore throat,  dysphagia, dental problems, itching, sneezing,  nasal congestion or discharge of excess  mucus or purulent secretions, ear ache,   fever, chills, sweats, unintended wt loss or wt gain, classically pleuritic or exertional cp,  orthopnea pnd or arm/hand swelling  or leg swelling, presyncope, palpitations, abdominal pain, anorexia, nausea, vomiting, diarrhea  or change in bowel habits or change in bladder habits, change in stools or change in urine, dysuria, hematuria,  rash, arthralgias, visual complaints, headache, numbness, weakness or ataxia or problems with walking or coordination,  change in mood or  memory.        Current Meds  Medication Sig   albuterol (VENTOLIN HFA) 108 (90 Base) MCG/ACT inhaler Inhale 2 puffs into the lungs every 6 (six) hours as needed for wheezing or shortness of breath.   ALPRAZolam (XANAX) 0.25 MG tablet Take 0.125 mg by mouth daily as needed for anxiety.    Aspirin-Caffeine (ANACIN) 400-32 MG TABS Take 2 tablets by mouth daily as needed (headaches/pain).   busPIRone (BUSPAR) 10 MG tablet Take 10 mg by mouth 2 (two) times daily.   diphenhydrAMINE-PSE-APAP (BENADRYL ALLERGY/COLD PO) Take by mouth.   PARoxetine (PAXIL) 20 MG tablet Take 20 mg by mouth daily.   [DISCONTINUED] budesonide -formoterol  (SYMBICORT ) 80-4.5 MCG/ACT inhaler INHALE 2 PUFFS IN THE MORNING AND ATBEDTIME            Past Medical History:  Diagnosis Date   Anemia    GERD (gastroesophageal reflux disease)    Headache    Insomnia    Iron deficiency        Objective:    Wts  02/22/2024      112  02/14/2023       112  09/01/2022       116  07/20/2022       114  06/28/2022       113  02/10/2022       121  03/03/2021     113  02/26/2020     111 02/11/2020       113   01/23/20 114 lb 9.6 oz (52 kg)  01/09/20 114 lb 3.2 oz (51.8 kg)  12/05/19 116 lb 1.6 oz (52.7 kg)    Vital signs reviewed  02/22/2024  - Note at rest 02 sats  100% on RA   General appearance:    pleasant amb wf nad     HEENT : Oropharynx  clear        NECK :  without  apparent JVD/ palpable Nodes/TM    LUNGS: no acc muscle use,  Nl contour chest which is clear to A and P bilaterally without cough on insp or exp maneuvers   CV:  RRR  no s3 or murmur or increase in P2, and no edema   ABD:  soft and nontender   MS:  Gait nl   ext warm without deformities Or obvious joint restrictions  calf tenderness, cyanosis or clubbing    SKIN: warm and dry without lesions    NEURO:  alert, approp, nl sensorium with  no motor or cerebellar deficits apparent.          Assessment     Assessment & Plan Cough variant asthma with component uacs  Flared x 08/2019 p covid 05/2019  - 01/09/2020   start breztri  2bid x 2 weeks then return - Allergy profile 01/09/2020 >  Eos 0.9 /  IgE  46 then repeat level  203 on 02/11/20 with active rash - 01/23/2020  After extensive coaching inhaler device,  effectiveness =    75% > change  to symb 80 2bid as breathing better but not the cough ? Bronchiectasis  > see sep a/p - 02/26/2020  After extensive coaching inhaler device,  effectiveness =    90%> continue symb 80 2bid   -Allergy referral 02/26/2020 > not done  - 07/20/2022  After extensive coaching inhaler device,  effectiveness =    90% hfa    All goals of chronic asthma control met including optimal function and elimination of symptoms with minimal need for rescue therapy on symbicort  80 2 puffs each am with pm doses optional   Contingencies discussed in full including contacting this office immediately if not controlling the symptoms using the rule of two's.      Obstructive bronchiectasis (HCC) Onset of refractory cough April 2021 p covid 05/2019  - flutter valve/ prone position sleeping rec 01/23/2020  - Sinus CT 02/04/2020 >>> neg - HRCT chest 10/262021 >>> mild bronchiectasis with micronodules - 02/06/2020 pt reported 90% improvement on symbicort  80 2bid plus flutter p augmentin  x 10 days  -  Quant Igs  02/11/20  Nl -   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   FS  Level 109  - PFT's  08/30/2022   FEV1 2.30 (97 % ) ratio 0.74  p 8 % improvement from saba p 0 prior to study with DLCO  20.2 (109%)   and FV curve slt concave    No recent flares, no change in rx needed  - keeping ICS dose as low as possible for asthma component to  reduce risk of infection.  Discussed in detail all the  indications, usual  risks and alternatives  relative to the benefits with patient who agrees to continue with Rx as outlined.         Each maintenance medication was reviewed in detail including emphasizing most importantly the difference between maintenance and prns and under what circumstances the prns are to be triggered using an action plan format where appropriate.  Total time for H and P, chart review, counseling, reviewing hfa  device(s) and generating customized AVS unique to this office visit / same day charting = 24 min            AVS  Patient Instructions  Continue symbicort  80 2 puffs at bedtime with AM doses optional for any flares  Please schedule a follow up visit in 12  months but call sooner if needed    Ozell America, MD 02/23/2024

## 2024-02-23 NOTE — Assessment & Plan Note (Addendum)
 Flared x 08/2019 p covid 05/2019  - 01/09/2020   start breztri  2bid x 2 weeks then return - Allergy profile 01/09/2020 >  Eos 0.9 /  IgE  46 then repeat level  203 on 02/11/20 with active rash - 01/23/2020  After extensive coaching inhaler device,  effectiveness =    75% > change to symb 80 2bid as breathing better but not the cough ? Bronchiectasis  > see sep a/p - 02/26/2020  After extensive coaching inhaler device,  effectiveness =    90%> continue symb 80 2bid   -Allergy referral 02/26/2020 > not done  - 07/20/2022  After extensive coaching inhaler device,  effectiveness =    90% hfa    All goals of chronic asthma control met including optimal function and elimination of symptoms with minimal need for rescue therapy on symbicort  80 2 puffs each am with pm doses optional   Contingencies discussed in full including contacting this office immediately if not controlling the symptoms using the rule of two's.

## 2024-02-23 NOTE — Assessment & Plan Note (Addendum)
 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  Discussed in detail all the  indications, usual  risks and alternatives  relative to the benefits with patient who agrees to continue with Rx as outlined.         Each maintenance medication was reviewed in detail including emphasizing most importantly the difference between maintenance and prns and under what circumstances the prns are to be triggered using an action plan format where appropriate.  Total time for H and P, chart review, counseling, reviewing hfa  device(s) and generating customized AVS unique to this office visit / same day charting = 24 min

## 2024-03-14 ENCOUNTER — Telehealth: Payer: Self-pay | Admitting: *Deleted

## 2024-03-14 MED ORDER — BUDESONIDE-FORMOTEROL FUMARATE 80-4.5 MCG/ACT IN AERO: 2.0000 | INHALATION_SPRAY | Freq: Two times a day (BID) | RESPIRATORY_TRACT | 12 refills | Status: AC

## 2024-03-14 NOTE — Telephone Encounter (Signed)
 Copied from CRM #[id number]. Topic: Clinical - Prescription Issue >> Mar 11, 2024  1:18 PM Russell PARAS wrote: Reason for CRM:   Pt is requesting if Breyna  inhaler can be called into pharmacy on file. She reports the budesonide  formeterol(Symbicort ) is over $200 anBreyna  would be cheaper. [facility] advised she would need prescription sent in from clinic.   CB#  [phone]  I have sent rx for Breyna  to her preferred pharm. Called her and left her a detailed msg that this was done.
